# Patient Record
Sex: Male | Born: 1970 | Race: White | Hispanic: No | Marital: Married | Smoking: Former smoker
Health system: Southern US, Community
[De-identification: ages and names within clinical notes are randomized; demographics above are authoritative.]

## PROBLEM LIST (undated history)

## (undated) DIAGNOSIS — F32A Depression, unspecified: Secondary | ICD-10-CM

## (undated) DIAGNOSIS — F419 Anxiety disorder, unspecified: Secondary | ICD-10-CM

## (undated) DIAGNOSIS — F329 Major depressive disorder, single episode, unspecified: Secondary | ICD-10-CM

## (undated) HISTORY — PX: WISDOM TOOTH EXTRACTION: SHX21

## (undated) HISTORY — PX: VASECTOMY: SHX75

## (undated) HISTORY — DX: Anxiety disorder, unspecified: F41.9

## (undated) HISTORY — DX: Major depressive disorder, single episode, unspecified: F32.9

## (undated) HISTORY — DX: Depression, unspecified: F32.A

---

## 1999-03-18 ENCOUNTER — Emergency Department (HOSPITAL_COMMUNITY): Admission: EM | Admit: 1999-03-18 | Discharge: 1999-03-18 | Payer: Self-pay

## 1999-04-13 ENCOUNTER — Emergency Department (HOSPITAL_COMMUNITY): Admission: EM | Admit: 1999-04-13 | Discharge: 1999-04-13 | Payer: Self-pay | Admitting: *Deleted

## 2004-01-18 ENCOUNTER — Emergency Department (HOSPITAL_COMMUNITY): Admission: EM | Admit: 2004-01-18 | Discharge: 2004-01-18 | Payer: Self-pay | Admitting: Emergency Medicine

## 2004-01-21 ENCOUNTER — Ambulatory Visit (HOSPITAL_COMMUNITY): Admission: RE | Admit: 2004-01-21 | Discharge: 2004-01-21 | Payer: Self-pay | Admitting: Orthopaedic Surgery

## 2004-02-16 ENCOUNTER — Encounter: Admission: RE | Admit: 2004-02-16 | Discharge: 2004-02-16 | Payer: Self-pay | Admitting: Orthopaedic Surgery

## 2004-05-16 ENCOUNTER — Emergency Department (HOSPITAL_COMMUNITY): Admission: EM | Admit: 2004-05-16 | Discharge: 2004-05-16 | Payer: Self-pay | Admitting: Emergency Medicine

## 2012-07-24 ENCOUNTER — Emergency Department (HOSPITAL_COMMUNITY)
Admission: EM | Admit: 2012-07-24 | Discharge: 2012-07-24 | Disposition: A | Discharge status: Home / Self Care | Payer: No Typology Code available for payment source | Attending: Emergency Medicine | Admitting: Emergency Medicine

## 2012-07-24 ENCOUNTER — Encounter (HOSPITAL_COMMUNITY): Payer: Self-pay | Admitting: Emergency Medicine

## 2012-07-24 ENCOUNTER — Emergency Department (HOSPITAL_COMMUNITY): Payer: No Typology Code available for payment source

## 2012-07-24 DIAGNOSIS — R079 Chest pain, unspecified: Secondary | ICD-10-CM

## 2012-07-24 DIAGNOSIS — R0602 Shortness of breath: Secondary | ICD-10-CM | POA: Insufficient documentation

## 2012-07-24 DIAGNOSIS — F1510 Other stimulant abuse, uncomplicated: Secondary | ICD-10-CM

## 2012-07-24 DIAGNOSIS — Z8719 Personal history of other diseases of the digestive system: Secondary | ICD-10-CM | POA: Insufficient documentation

## 2012-07-24 DIAGNOSIS — Z79899 Other long term (current) drug therapy: Secondary | ICD-10-CM | POA: Insufficient documentation

## 2012-07-24 LAB — COMPREHENSIVE METABOLIC PANEL
Albumin: 4.1 g/dL (ref 3.5–5.2)
BUN: 12 mg/dL (ref 6–23)
CO2: 19 mEq/L (ref 19–32)
Calcium: 9.6 mg/dL (ref 8.4–10.5)
Creatinine, Ser: 0.97 mg/dL (ref 0.50–1.35)
GFR calc Af Amer: >90 mL/min (ref >90)
GFR calc non Af Amer: >90 mL/min (ref >90)
Sodium: 138 mEq/L (ref 135–145)

## 2012-07-24 LAB — POCT I-STAT TROPONIN I: Troponin i, poc: 0.01 ng/mL (ref 0.00–0.08)

## 2012-07-24 LAB — CBC
Platelets: 335 10*3/uL (ref 150–400)
RBC: 5.31 MIL/uL (ref 4.22–5.81)
RDW: 13 % (ref 11.5–15.5)

## 2012-07-24 LAB — APTT: aPTT: 33 seconds (ref 24–37)

## 2012-07-24 LAB — PROTIME-INR: INR: 1.04 (ref 0.00–1.49)

## 2012-07-24 MED ORDER — ASPIRIN 81 MG PO CHEW
324.0000 mg | CHEWABLE_TABLET | Freq: Once | ORAL | Status: AC
  Administered 2012-07-24: 324 mg via ORAL
  Filled 2012-07-24: qty 4

## 2012-07-24 MED ORDER — NITROGLYCERIN 0.4 MG SL SUBL: 0.4000 mg | SUBLINGUAL_TABLET | SUBLINGUAL | Status: DC | PRN

## 2012-07-24 MED ORDER — SODIUM CHLORIDE 0.9 % IV SOLN
1000.0000 mL | INTRAVENOUS | Status: DC
  Administered 2012-07-24: 1000 mL via INTRAVENOUS

## 2012-07-24 MED ORDER — NITROGLYCERIN 2 % TD OINT
1.0000 [in_us] | TOPICAL_OINTMENT | Freq: Once | TRANSDERMAL | Status: AC
  Administered 2012-07-24: 1 [in_us] via TOPICAL
  Filled 2012-07-24: qty 30

## 2012-07-24 MED ORDER — POTASSIUM CHLORIDE CRYS ER 20 MEQ PO TBCR
40.0000 meq | EXTENDED_RELEASE_TABLET | Freq: Once | ORAL | Status: AC
  Administered 2012-07-24: 40 meq via ORAL
  Filled 2012-07-24: qty 2

## 2012-07-24 MED ORDER — LORAZEPAM 2 MG/ML IJ SOLN
1.0000 mg | Freq: Once | INTRAMUSCULAR | Status: AC
  Administered 2012-07-24: 1 mg via INTRAVENOUS
  Filled 2012-07-24: qty 1

## 2012-07-24 MED ORDER — MORPHINE SULFATE 4 MG/ML IJ SOLN
4.0000 mg | Freq: Once | INTRAMUSCULAR | Status: AC
  Administered 2012-07-24: 4 mg via INTRAVENOUS
  Filled 2012-07-24: qty 1

## 2012-07-24 NOTE — ED Notes (Signed)
md at bedside  Pt alert and oriented x4. Respirations even and unlabored, bilateral symmetrical rise and fall of chest. Skin warm and dry. In no acute distress. Denies needs.   

## 2012-07-24 NOTE — ED Notes (Signed)
Pt states chest pain started 30 minutes ago.  Pt feels sob, shaky and weak.  Pt states he used meth in last day.

## 2012-07-24 NOTE — ED Notes (Signed)
Pt found standing beside bed trying to urinate.  HR was increased 140.  Pt placed back in bed and heart rate 75 and irregular.  MD notified.

## 2012-07-24 NOTE — ED Notes (Signed)
Per md pt allowed to have food 

## 2012-07-24 NOTE — ED Provider Notes (Signed)
History     CSN: [id number]  Arrival date & time 07/24/12  1700   First MD Initiated Contact with Patient 07/24/12 1702      Chief Complaint  Patient presents with  . Chest Pain    HPI Patient presents to the emergency room with chest pain that started about 30 minutes ago. The pain is severe in the left side of his chest. He feels short of breath shaky and weak. The patient has been using methamphetamine for the last several days. He woke up this morning stating he felt a little bit lightheaded and dizzy. He did not eat breakfast and then had several revels. Despite that, after he came home from work, he used a small amount of methamphetamine again. The chest pain started sometime after that. Patient does feel somewhat agitated. He does not feel like he can get comfortable. He denies any numbness or weakness. He denies any history of heart disease. There is no family history of heart disease but there is a family history of alcohol abuse. History reviewed. No pertinent past medical history. except a history of GERD  History reviewed. No pertinent past surgical history.  History reviewed. No pertinent family history.  History  Substance Use Topics  . Smoking status: Never Smoker   . Smokeless tobacco: Not on file  . Alcohol Use: Yes      Review of Systems  All other systems reviewed and are negative.    Allergies  Review of patient's allergies indicates no known allergies.  Home Medications   Current Outpatient Rx  Name  Route  Sig  Dispense  Refill  . ranitidine (ZANTAC) 150 MG tablet   Oral   Take 150 mg by mouth 2 (two) times daily as needed for heartburn.         . TRAZODONE HCL PO   Oral   Take 1-2 tablets by mouth at bedtime as needed (for sleep.).           BP 157/97  Pulse 98  Temp(Src) 97.9 F (36.6 C) (Oral)  Resp 17  SpO2 98%  Physical Exam  Nursing note and vitals reviewed. Constitutional: He appears well-developed and well-nourished. He  appears distressed.  Uncomfortable appearing, constantly moving in the bed  HENT:  Head: Normocephalic and atraumatic.  Right Ear: External ear normal.  Left Ear: External ear normal.  Eyes: Conjunctivae are normal. Right eye exhibits no discharge. Left eye exhibits no discharge. No scleral icterus.  Neck: Neck supple. No tracheal deviation present.  Cardiovascular: Normal rate, regular rhythm and intact distal pulses.   Pulmonary/Chest: Effort normal and breath sounds normal. No stridor. No respiratory distress. He has no wheezes. He has no rales.  Abdominal: Soft. Bowel sounds are normal. He exhibits no distension. There is no tenderness. There is no rebound and no guarding.  Musculoskeletal: He exhibits no edema and no tenderness.  Neurological: He is alert. He has normal strength. No sensory deficit. Cranial nerve deficit:  no gross defecits noted. He exhibits normal muscle tone. He displays no seizure activity. Coordination normal.  Skin: Skin is warm and dry. No rash noted.  Psychiatric: He has a normal mood and affect.    ED Course  Procedures (including critical care time) EKG Normal sinus rhythm, and rate 99 Normal axis normal intervals Normal ST-T waves No prior EKG for comparison Labs Reviewed  CBC - Abnormal; Notable for the following:    WBC 12.1 (*)    Hemoglobin 17.4 (*)  MCHC 36.6 (*)    All other components within normal limits  COMPREHENSIVE METABOLIC PANEL - Abnormal; Notable for the following:    Potassium 3.0 (*)    Glucose, Bld 102 (*)    All other components within normal limits  PROTIME-INR  APTT  POCT I-STAT TROPONIN I  POCT I-STAT TROPONIN I   Dg Chest Portable 1 View  07/24/2012  *RADIOLOGY REPORT*  Clinical Data: Chest pain and dizziness.  PORTABLE CHEST - 1 VIEW  Comparison: PA and lateral chest 05/16/2004.  Findings: Lungs clear.  Heart size normal.  No pneumothorax or pleural effusion.  IMPRESSION: No acute finding.   Original Report  Authenticated By: Holley Dexter, M.D.      1. Methamphetamine abuse   2. Chest pain       MDM  The patient has remained pain-free in the emergency department. His cardiac markers, 2 sets were normal. His EKG is unremarkable. I suspect his symptoms were related to his methamphetamine use. He does not have any evidence of cardiac ischemia at this time. Patient's heart rate the bedside is in the 70s. I instructed the patient to avoid methamphetamine use. He should decrease his consumption of revels. I recommended he eat  a good diet and exercise regularly.       Celene Kras, MD 07/24/12 2136

## 2012-10-11 ENCOUNTER — Other Ambulatory Visit (HOSPITAL_COMMUNITY): Payer: Self-pay | Admitting: Family Medicine

## 2012-10-11 DIAGNOSIS — R002 Palpitations: Secondary | ICD-10-CM

## 2012-10-11 DIAGNOSIS — G459 Transient cerebral ischemic attack, unspecified: Secondary | ICD-10-CM

## 2012-10-11 DIAGNOSIS — R55 Syncope and collapse: Secondary | ICD-10-CM

## 2012-11-12 ENCOUNTER — Ambulatory Visit (HOSPITAL_COMMUNITY)
Admission: RE | Admit: 2012-11-12 | Discharge: 2012-11-12 | Disposition: A | Discharge status: Home / Self Care | Payer: No Typology Code available for payment source | Source: Ambulatory Visit | Attending: Family Medicine | Admitting: Family Medicine

## 2012-11-12 DIAGNOSIS — R55 Syncope and collapse: Secondary | ICD-10-CM | POA: Insufficient documentation

## 2012-11-12 DIAGNOSIS — F17200 Nicotine dependence, unspecified, uncomplicated: Secondary | ICD-10-CM | POA: Insufficient documentation

## 2012-11-12 DIAGNOSIS — R29898 Other symptoms and signs involving the musculoskeletal system: Secondary | ICD-10-CM | POA: Insufficient documentation

## 2012-11-12 DIAGNOSIS — R209 Unspecified disturbances of skin sensation: Secondary | ICD-10-CM | POA: Insufficient documentation

## 2012-11-12 DIAGNOSIS — R42 Dizziness and giddiness: Secondary | ICD-10-CM

## 2012-11-12 DIAGNOSIS — G459 Transient cerebral ischemic attack, unspecified: Secondary | ICD-10-CM | POA: Insufficient documentation

## 2012-11-12 DIAGNOSIS — R002 Palpitations: Secondary | ICD-10-CM

## 2012-11-12 NOTE — Progress Notes (Signed)
*  PRELIMINARY RESULTS* Vascular Ultrasound Carotid Duplex (Doppler) has been completed.   There is no evidence of hemodynamically significant internal carotid artery stenosis bilaterally. Vertebral arteries are patent with antegrade flow.  11/12/2012 2:30 PM Gertie Fey, RVT, RDCS, RDMS

## 2012-11-12 NOTE — Progress Notes (Signed)
*  PRELIMINARY RESULTS* Echocardiogram 2D Echocardiogram has been performed.  Joe Farrell 11/12/2012, 10:36 AM

## 2013-10-13 ENCOUNTER — Ambulatory Visit (HOSPITAL_COMMUNITY)
Admission: RE | Admit: 2013-10-13 | Discharge: 2013-10-13 | Disposition: A | Discharge status: Home / Self Care | Payer: No Typology Code available for payment source | Attending: Psychiatry | Admitting: Psychiatry

## 2013-10-14 ENCOUNTER — Inpatient Hospital Stay (HOSPITAL_COMMUNITY)
Admission: EM | Admit: 2013-10-14 | Discharge: 2013-10-17 | DRG: 897 | Disposition: A | Discharge status: Rehabilitation Facility | Payer: No Typology Code available for payment source | Source: Intra-hospital | Attending: Psychiatry | Admitting: Psychiatry

## 2013-10-14 ENCOUNTER — Encounter (HOSPITAL_COMMUNITY): Payer: Self-pay | Admitting: *Deleted

## 2013-10-14 ENCOUNTER — Encounter (HOSPITAL_COMMUNITY): Payer: Self-pay | Admitting: Emergency Medicine

## 2013-10-14 ENCOUNTER — Emergency Department (HOSPITAL_COMMUNITY)
Admission: EM | Admit: 2013-10-14 | Discharge: 2013-10-14 | Disposition: A | Discharge status: Home / Self Care | Payer: No Typology Code available for payment source | Attending: Emergency Medicine | Admitting: Emergency Medicine

## 2013-10-14 DIAGNOSIS — F4310 Post-traumatic stress disorder, unspecified: Secondary | ICD-10-CM | POA: Diagnosis present

## 2013-10-14 DIAGNOSIS — F1420 Cocaine dependence, uncomplicated: Principal | ICD-10-CM | POA: Diagnosis present

## 2013-10-14 DIAGNOSIS — F17200 Nicotine dependence, unspecified, uncomplicated: Secondary | ICD-10-CM | POA: Diagnosis present

## 2013-10-14 DIAGNOSIS — R45851 Suicidal ideations: Secondary | ICD-10-CM | POA: Diagnosis not present

## 2013-10-14 DIAGNOSIS — F410 Panic disorder [episodic paroxysmal anxiety] without agoraphobia: Secondary | ICD-10-CM | POA: Diagnosis present

## 2013-10-14 DIAGNOSIS — F1994 Other psychoactive substance use, unspecified with psychoactive substance-induced mood disorder: Secondary | ICD-10-CM | POA: Diagnosis present

## 2013-10-14 DIAGNOSIS — F321 Major depressive disorder, single episode, moderate: Secondary | ICD-10-CM | POA: Diagnosis present

## 2013-10-14 DIAGNOSIS — G4700 Insomnia, unspecified: Secondary | ICD-10-CM | POA: Diagnosis present

## 2013-10-14 DIAGNOSIS — F411 Generalized anxiety disorder: Secondary | ICD-10-CM | POA: Diagnosis present

## 2013-10-14 DIAGNOSIS — R45850 Homicidal ideations: Secondary | ICD-10-CM | POA: Insufficient documentation

## 2013-10-14 DIAGNOSIS — F919 Conduct disorder, unspecified: Secondary | ICD-10-CM | POA: Insufficient documentation

## 2013-10-14 DIAGNOSIS — F1010 Alcohol abuse, uncomplicated: Secondary | ICD-10-CM | POA: Diagnosis present

## 2013-10-14 DIAGNOSIS — F1410 Cocaine abuse, uncomplicated: Secondary | ICD-10-CM | POA: Insufficient documentation

## 2013-10-14 LAB — RAPID URINE DRUG SCREEN, HOSP PERFORMED
AMPHETAMINES: NOT DETECTED
BENZODIAZEPINES: NOT DETECTED
Barbiturates: NOT DETECTED
Cocaine: POSITIVE — AB
OPIATES: NOT DETECTED
TETRAHYDROCANNABINOL: NOT DETECTED

## 2013-10-14 LAB — CBC
HCT: 46.4 % (ref 39.0–52.0)
Hemoglobin: 16.5 g/dL (ref 13.0–17.0)
MCH: 32.4 pg (ref 26.0–34.0)
MCHC: 35.6 g/dL (ref 30.0–36.0)
MCV: 91 fL (ref 78.0–100.0)
PLATELETS: 252 10*3/uL (ref 150–400)
RBC: 5.1 MIL/uL (ref 4.22–5.81)
RDW: 12.9 % (ref 11.5–15.5)
WBC: 10.9 10*3/uL — AB (ref 4.0–10.5)

## 2013-10-14 LAB — COMPREHENSIVE METABOLIC PANEL
ALBUMIN: 4 g/dL (ref 3.5–5.2)
ALT: 14 U/L (ref 0–53)
AST: 25 U/L (ref 0–37)
Alkaline Phosphatase: 99 U/L (ref 39–117)
BUN: 17 mg/dL (ref 6–23)
CALCIUM: 9.8 mg/dL (ref 8.4–10.5)
CHLORIDE: 99 meq/L (ref 96–112)
CO2: 24 meq/L (ref 19–32)
Creatinine, Ser: 1.25 mg/dL (ref 0.50–1.35)
GFR calc Af Amer: 81 mL/min — ABNORMAL LOW (ref >90)
GFR, EST NON AFRICAN AMERICAN: 70 mL/min — AB (ref >90)
Glucose, Bld: 120 mg/dL — ABNORMAL HIGH (ref 70–99)
Potassium: 4.3 mEq/L (ref 3.7–5.3)
SODIUM: 139 meq/L (ref 137–147)
Total Bilirubin: 0.4 mg/dL (ref 0.3–1.2)
Total Protein: 7.4 g/dL (ref 6.0–8.3)

## 2013-10-14 LAB — ACETAMINOPHEN LEVEL: Acetaminophen (Tylenol), Serum: <15 ug/mL (ref 10–30)

## 2013-10-14 LAB — ETHANOL: Alcohol, Ethyl (B): <11 mg/dL (ref 0–11)

## 2013-10-14 LAB — SALICYLATE LEVEL

## 2013-10-14 MED ORDER — MAGNESIUM HYDROXIDE 400 MG/5ML PO SUSP: 30.0000 mL | Freq: Every day | ORAL | Status: DC | PRN

## 2013-10-14 MED ORDER — TRAZODONE HCL 50 MG PO TABS
50.0000 mg | ORAL_TABLET | Freq: Every evening | ORAL | Status: DC | PRN
  Filled 2013-10-14 (×4): qty 1

## 2013-10-14 MED ORDER — HYDROXYZINE HCL 25 MG PO TABS
25.0000 mg | ORAL_TABLET | Freq: Four times a day (QID) | ORAL | Status: DC | PRN
  Filled 2013-10-14: qty 12

## 2013-10-14 MED ORDER — TRAZODONE HCL 100 MG PO TABS
100.0000 mg | ORAL_TABLET | Freq: Every evening | ORAL | Status: DC | PRN
  Filled 2013-10-14: qty 1
  Filled 2013-10-14: qty 8
  Filled 2013-10-14 (×5): qty 1
  Filled 2013-10-14: qty 8
  Filled 2013-10-14 (×2): qty 1

## 2013-10-14 MED ORDER — ALUM & MAG HYDROXIDE-SIMETH 200-200-20 MG/5ML PO SUSP: 30.0000 mL | ORAL | Status: DC | PRN

## 2013-10-14 MED ORDER — PANTOPRAZOLE SODIUM 20 MG PO TBEC
20.0000 mg | DELAYED_RELEASE_TABLET | Freq: Every day | ORAL | Status: DC
  Administered 2013-10-14 – 2013-10-17 (×4): 20 mg via ORAL
  Filled 2013-10-14 (×7): qty 1

## 2013-10-14 MED ORDER — LORAZEPAM 1 MG PO TABS: 1.0000 mg | ORAL_TABLET | Freq: Four times a day (QID) | ORAL | Status: DC | PRN

## 2013-10-14 MED ORDER — ACETAMINOPHEN 325 MG PO TABS: 650.0000 mg | ORAL_TABLET | Freq: Four times a day (QID) | ORAL | Status: DC | PRN
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
## 2013-10-14 NOTE — [facility] Group Notes (Signed)
[place] LCSW Group Therapy  10/14/2013 1:32 PM  Type of Therapy:  Group Therapy  Participation Level:  Active  Participation Quality:  Attentive  Affect:  Appropriate  Cognitive:  Alert and Oriented  Insight:  Improving  Engagement in Therapy:  Improving  Modes of Intervention:  Confrontation, Discussion, Education, Exploration, Problem-solving, Rapport Building, Socialization and Support  Summary of Progress/Problems: [facility] Speaker came to talk about his personal journey with substance abuse and addiction. [place] processed ways by which to relate to the speaker. [place] speaker provided handouts and educational information pertaining to groups and services offered by the [facility].   Oaklan Persons Smart LCSWA 10/14/2013, 1:32 PM
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
## 2013-10-14 NOTE — Progress Notes (Signed)
Recreation Therapy Notes  Animal-Assisted Activity/Therapy (AAA/T) Program Checklist/Progress Notes Patient Eligibility Criteria Checklist & Daily Group note for Rec Tx Intervention  Date: 05.26.2015 Time: 2:45pm Location: [address]    AAA/T Program Assumption of Risk Form signed by Patient/ or Parent Legal Guardian yes  Patient is free of allergies or sever asthma yes  Patient reports no fear of animals yes  Patient reports no history of cruelty to animals yes   Patient understands his/her participation is voluntary yes  Behavioral Response: Did not attend.    Laureen Ochs Eilee Schader, LRT/CTRS  Lane Hacker 10/14/2013 4:57 PM

## 2013-10-14 NOTE — Discharge Instructions (Signed)
Polysubstance Abuse When people abuse more than one drug or type of drug it is called polysubstance or polydrug abuse. For example, many smokers also drink alcohol. This is one form of polydrug abuse. Polydrug abuse also refers to the use of a drug to counteract an unpleasant effect produced by another drug. It may also be used to help with withdrawal from another drug. People who take stimulants may become agitated. Sometimes this agitation is countered with a tranquilizer. This helps protect against the unpleasant side effects. Polydrug abuse also refers to the use of different drugs at the same time.  Anytime drug use is interfering with normal living activities, it has become abuse. This includes problems with family and friends. Psychological dependence has developed when your mind tells you that the drug is needed. This is usually followed by physical dependence which has developed when continuing increases of drug are required to get the same feeling or "high". This is known as addiction or chemical dependency. A person's risk is much higher if there is a history of chemical dependency in the family. SIGNS OF CHEMICAL DEPENDENCY  You have been told by friends or family that drugs have become a problem.  You fight when using drugs.  You are having blackouts (not remembering what you do while using).  You feel sick from using drugs but continue using.  You lie about use or amounts of drugs (chemicals) used.  You need chemicals to get you going.  You are suffering in work performance or in school because of drug use.  You get sick from use of drugs but continue to use anyway.  You need drugs to relate to people or feel comfortable in social situations.  You use drugs to forget problems. "Yes" answered to any of the above signs of chemical dependency indicates there are problems. The longer the use of drugs continues, the greater the problems will become. If there is a family history of  drug or alcohol use, it is best not to experiment with these drugs. Continual use leads to tolerance. After tolerance develops more of the drug is needed to get the same feeling. This is followed by addiction. With addiction, drugs become the most important part of life. It becomes more important to take drugs than participate in the other usual activities of life. This includes relating to friends and family. Addiction is followed by dependency. Dependency is a condition where drugs are now needed not just to get high, but to feel normal. Addiction cannot be cured but it can be stopped. This often requires outside help and the care of professionals. Treatment centers are listed in the yellow pages under: Cocaine, Narcotics, and Alcoholics Anonymous. Most hospitals and clinics can refer you to a specialized care center. Talk to your caregiver if you need help. Document Released: 12/28/2004 Document Revised: 07/31/2011 Document Reviewed: 05/08/2005 [facility] Patient Information 2014 [place].
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
## 2013-10-14 NOTE — Progress Notes (Signed)
D: Patient denies SI/HI and auditory and visual hallucinations. The patient has an irritable mood and affect. The patient has been resting frequently throughout the shift due to detox. The patient is having some tremors, anxiety, and headache.  A: Patient given emotional support from RN. Patient encouraged to come to staff with concerns and/or questions. Patient's medication routine continued. Patient's orders and plan of care reviewed. Patient was offered medication for symptoms but patient denies any at this time.  R: Patient remains cooperative. Will continue to monitor patient q15 minutes for safety.
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
## 2013-10-14 NOTE — ED Provider Notes (Signed)
CSN: [id number]     Arrival date & time 10/14/13  0037 History   First MD Initiated Contact with Patient 10/14/13 0103     Chief Complaint  Patient presents with  . Detox     (Consider location/radiation/quality/duration/timing/severity/associated sxs/prior Treatment) HPI Comments: 43 year old male presents to the emergency department and behavioral health for medical clearance. Patient with bad at behavioral health for further evaluation and management of his suicidal ideations. Patient denies any suicidal plan. Patient also reports homicidal ideations to triage nurse, but would not state who he wants to harm. Patient also a history of cocaine abuse. Last cocaine use was 10 PM yesterday. Patient denies alcohol use or any other abuse drugs. He has no other pain complaints at this time.  The history is provided by the patient. No language interpreter was used.    History reviewed. No pertinent past medical history. Past Surgical History  Procedure Laterality Date  . Wisdom tooth extraction     History reviewed. No pertinent family history. History  Substance Use Topics  . Smoking status: Current Every Day Smoker -- 1.00 packs/day    Types: Cigarettes  . Smokeless tobacco: Never Used  . Alcohol Use: Yes    Review of Systems  Psychiatric/Behavioral: Positive for suicidal ideas and behavioral problems (substance abuse).  All other systems reviewed and are negative.    Allergies  Review of patient's allergies indicates no known allergies.  Home Medications   Prior to Admission medications   Medication Sig Start Date End Date Taking? Authorizing Provider  ranitidine (ZANTAC) 150 MG tablet Take 150 mg by mouth 2 (two) times daily as needed for heartburn.    Historical Provider, MD  TRAZODONE HCL PO Take 1-2 tablets by mouth at bedtime as needed (for sleep.).    Historical Provider, MD   BP 109/86  Pulse 73  Temp(Src) 98.1 F (36.7 C) (Oral)  Resp 18  Ht 6' 1.5" (1.867 m)   Wt 197 lb (89.359 kg)  BMI 25.64 kg/m2  SpO2 97%  Physical Exam  Nursing note and vitals reviewed. Constitutional: He is oriented to person, place, and time. He appears well-developed and well-nourished. No distress.  HENT:  Head: Normocephalic and atraumatic.  Eyes: Conjunctivae and EOM are normal. No scleral icterus.  Neck: Normal range of motion.  Cardiovascular: Normal rate, regular rhythm and normal heart sounds.   Pulmonary/Chest: Effort normal and breath sounds normal. No respiratory distress. He has no wheezes. He has no rales.  Abdominal: Soft. He exhibits no distension. There is no tenderness.  Musculoskeletal: Normal range of motion.  Neurological: He is alert and oriented to person, place, and time. GCS eye subscore is 4. GCS verbal subscore is 5. GCS motor subscore is 6.  Patient answers questions appropriately and follows commands. He moves his extremities without ataxia.  Skin: Skin is warm and dry. No rash noted. He is not diaphoretic. No erythema. No pallor.  Psychiatric: He has a normal mood and affect. His speech is normal. He is slowed. He is not actively hallucinating. He expresses suicidal ideation. He expresses no homicidal ideation. He expresses no suicidal plans and no homicidal plans.  Patient appears sleepy and slowed.    ED Course  Procedures (including critical care time) Labs Review Labs Reviewed  CBC - Abnormal; Notable for the following:    WBC 10.9 (*)    All other components within normal limits  COMPREHENSIVE METABOLIC PANEL - Abnormal; Notable for the following:    Glucose, Bld 120 (*)  GFR calc non Af Amer 70 (*)    GFR calc Af Amer 81 (*)    All other components within normal limits  SALICYLATE LEVEL - Abnormal; Notable for the following:    Salicylate Lvl <0.3 (*)    All other components within normal limits  URINE RAPID DRUG SCREEN (HOSP PERFORMED) - Abnormal; Notable for the following:    Cocaine POSITIVE (*)    All other components  within normal limits  ACETAMINOPHEN LEVEL  ETHANOL    Imaging Review No results found.   EKG Interpretation None      MDM   Final diagnoses:  Suicidal ideations  Cocaine abuse    Patient presents for medical clearance for suicidal ideations and cocaine abuse. Patient accepted at behavioral health for further management of psychiatric problems. Labs reviewed; positive for cocaine on UDS. Patient medically cleared and stable for transfer back to [facility]   Filed Vitals:   10/14/13 0040 10/14/13 0306  BP: 117/75 109/86  Pulse: 90 73  Temp: 98.1 F (36.7 C) 98.1 F (36.7 C)  TempSrc: Oral Oral  Resp: 16 18  Height: 6' 1.5" (1.867 m)   Weight: 197 lb (89.359 kg)   SpO2: 98% 97%       Antonietta Breach, PA-C 10/14/13 [phone]
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
## 2013-10-14 NOTE — ED Notes (Signed)
Report given to RN at [facility], pt ready for transportation to [facility], [place] called

## 2013-10-14 NOTE — Progress Notes (Signed)
[facility] - 0900  The focus of this group is to educate the patient on the purpose and policies of crisis stabilization and provide a format to answer questions about their admission.  The group details unit policies and expectations of patients while admitted.  Patient did not attend.

## 2013-10-15 NOTE — [facility] Group Notes (Signed)
[place] LCSW Group Therapy  10/15/2013 2:59 PM  Type of Therapy:  Group Therapy  Participation Level:  Did Not Attend-pt in room sleeping.   Meloney Feld Smart LCSWA  10/15/2013, 2:59 PM

## 2013-10-15 NOTE — ED Provider Notes (Signed)
Medical screening examination/treatment/procedure(s) were performed by non-physician practitioner and as supervising physician I was immediately available for consultation/collaboration.   EKG Interpretation None       Varney Biles, MD 10/15/13 [phone]

## 2013-10-15 NOTE — Clinical Social Work Note (Signed)
CSW submitted referral to [facility] [place] per pt request.  Maxie Better, LCSWA 10/15/2013 1:05 PM
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
## 2013-10-15 NOTE — Progress Notes (Signed)
Patient ID: Joe Farrell, male   DOB: 1970/08/04, 43 y.o.   MRN: [id number] He has been in bed most of day and up for part of a group.  Has had poor interaction with peers and staff, Self inventory: depreswsion 8, Hopelessness 8, withdrawal of agitation and he denies SI thoughts.

## 2013-10-15 NOTE — Progress Notes (Signed)
D Pt. Denies SI and HI, no complaints of pain or discomfort noted. A Writer offered support and encouragement, discussed needs with pt.  R Pt. Remains safe on the unit,  Pt. Reports depression has lessened, but appeared very anxious.  Pt. States his biggest issue is nicotine withdrawal but refused a patch at this time.

## 2013-10-15 NOTE — Tx Team (Signed)
Interdisciplinary Treatment Plan Update (Adult)  Date: 10/15/2013   Time Reviewed: 11:30 AM  Progress in Treatment:  Attending groups: intermittently  Participating in groups: When he attends  Taking medication as prescribed: Yes  Tolerating medication: Yes  Family/Significant othe contact made: Not yet. SPE required for this pt.  Patient understands diagnosis: Yes, AEB seeking treatment for crack cocaine abuse, medication management, and mood stabilization.  Discussing patient identified problems/goals with staff: Yes  Medical problems stabilized or resolved: Yes  Denies suicidal/homicidal ideation: Yes during group/self report.  Patient has not harmed self or Others: Yes  New problem(s) identified:  Discharge Plan or Barriers: Pt hoping for [facility] and [facility] referral. CSW assessing. Pt unsure if he can miss any more work.  Additional comments: [name] who stated he had a period of sobriety, but in March the use of crack has gotten out of control. States he just "got addicted." Used to drink until he started smoking crack this way. [name] He is drinking less now. He came encouraged by his GF requesting help. He was endorsing suicidal ideas, with multiple ways of doing it. He has had two previous suicidal attempts. ( stated he had been up 3 days using crack and that he just wants to be left alone and sleep)  Reason for Continuation of Hospitalization: Medication management Mood stabilization Ativan taper-withdrawals  Estimated length of stay: 3-5 days  For review of initial/current patient goals, please see plan of care.  Attendees:  Patient:    Family:    Physician: Carlton Adam MD 10/15/2013 11:30 AM   Nursing: Arminda Resides RN 10/15/2013 11:30 AM   Clinical Social Worker [place], [place]  10/15/2013 11:30 AM   Other: Gerline Legacy Nurse CM 10/15/2013 11:30 AM   Other:    Other: Aggie N. PA  10/15/2013. 11:30 AM   Other:    Scribe for Treatment Team:  [place] LCSWA 10/15/2013  11:30 AM
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
## 2013-10-15 NOTE — BHH Group Notes (Signed)
[facility] LCSW Aftercare Discharge Planning Group Note   10/15/2013 10:14 AM  Participation Quality:  DID NOT ATTEND-pt in room resting  [facility]

## 2013-10-16 NOTE — Progress Notes (Signed)
D.  Pt. Denies SI/HI and denies A/V hallucinations.  Attended Starr.  Denies withdrawal symptoms.  No concerns or issues voiced. A.  Encouragement and support given.  R.  Pt. Receptive and remains safe.

## 2013-10-16 NOTE — Progress Notes (Signed)
[facility] MD Progress Note  10/16/2013 5:39 PM JAYLYNN SIEFERT  MRN:  [id number] Subjective:  Jeromey is trying to get his life back together. States that he knows he needs to do something or he is going to lose everything he has got. He has work that he has to do by a certain date otherwise he is going to be penalized financially. He also has his son's graduation and going into the [place] [employer] [place] in the next few weeks. That is waiting heavily on his mind but also understands that the consequences of not taking care of this problem now Diagnosis:   DSM5: Schizophrenia Disorders:  none Obsessive-Compulsive Disorders:  none Trauma-Stressor Disorders:  none Substance/Addictive Disorders:  Alcohol Related Disorder - Moderate (303.90), Cocaine use disorder severe Depressive Disorders:  Major Depressive Disorder - Moderate (296.22) Total Time spent with patient: 30 minutes  Axis I: Substance Induced Mood Disorder  ADL's:  Intact  Sleep: Fair  Appetite:  Fair  Suicidal Ideation:  Plan:  denies Intent:  denies Means:  denies Homicidal Ideation:  Plan:  denies Intent:  denies Means:  denies AEB (as evidenced by):  Psychiatric Specialty Exam: Physical Exam  Review of Systems  Constitutional: Positive for malaise/fatigue.  HENT: Negative.   Eyes: Negative.   Respiratory: Negative.   Cardiovascular: Negative.   Gastrointestinal: Negative.   Genitourinary: Negative.   Musculoskeletal: Negative.   Skin: Negative.   Neurological: Negative.   Endo/Heme/Allergies: Negative.   Psychiatric/Behavioral: Positive for substance abuse. The patient is nervous/anxious.     Blood pressure 123/79, pulse 74, temperature 97.9 F (36.6 C), temperature source Oral, resp. rate 17, height 6' 1"  (1.854 m), weight 83.462 kg (184 lb), SpO2 98.00%.Body mass index is 24.28 kg/(m^2).  General Appearance: Fairly Groomed  Engineer, water::  Fair  Speech:  Clear and Coherent  Volume:  fluctuates  Mood:  Anxious, Irritable and  worried, frustrated  Affect:  anxious, worried  Thought Process:  Coherent and Goal Directed  Orientation:  Full (Time, Place, and Person)  Thought Content:  symtpoms, worries, concerns  Suicidal Thoughts:  No  Homicidal Thoughts:  No  Memory:  Immediate;   Fair Recent;   Fair Remote;   Fair  Judgement:  Fair  Insight:  Present and Shallow  Psychomotor Activity:  Restlessness  Concentration:  Fair  Recall:  [employer] of Knowledge:NA  Language: Fair  Akathisia:  No  Handed:    AIMS (if indicated):     Assets:  Desire for Improvement  Sleep:  Number of Hours: 6.75   Musculoskeletal: Strength & Muscle Tone: within normal limits Gait & Station: normal Patient leans: N/A  Current Medications: Current Facility-Administered Medications  Medication Dose Route Frequency Provider Last Rate Last Dose  . acetaminophen (TYLENOL) tablet 650 mg  650 mg Oral Q6H PRN Laverle Hobby, PA-C      . alum & mag hydroxide-simeth (MAALOX/MYLANTA) 200-200-20 MG/5ML suspension 30 mL  30 mL Oral Q4H PRN Laverle Hobby, PA-C      . hydrOXYzine (ATARAX/VISTARIL) tablet 25 mg  25 mg Oral Q6H PRN Laverle Hobby, PA-C      . LORazepam (ATIVAN) tablet 1 mg  1 mg Oral Q6H PRN Nicholaus Bloom, MD      . magnesium hydroxide (MILK OF MAGNESIA) suspension 30 mL  30 mL Oral Daily PRN Laverle Hobby, PA-C      . pantoprazole (PROTONIX) EC tablet 20 mg  20 mg Oral Daily Laverle Hobby, PA-C  20 mg at 10/16/13 0801  . traZODone (DESYREL) tablet 100 mg  100 mg Oral QHS,MR X 1 Nicholaus Bloom, MD        Lab Results: No results found for this or any previous visit (from the past 48 hour(s)).  Physical Findings: AIMS: Facial and Oral Movements Muscles of Facial Expression: None, normal Lips and Perioral Area: None, normal Jaw: None, normal Tongue: None, normal,Extremity Movements Upper (arms, wrists, hands, fingers): None, normal Lower (legs, knees, ankles, toes): None, normal, Trunk Movements Neck, shoulders,  hips: None, normal, Overall Severity Severity of abnormal movements (highest score from questions above): None, normal Incapacitation due to abnormal movements: None, normal Patient's awareness of abnormal movements (rate only patient's report): No Awareness, Dental Status Current problems with teeth and/or dentures?: No Does patient usually wear dentures?: No  CIWA:  CIWA-Ar Total: 5 COWS:     Treatment Plan Summary: Daily contact with patient to assess and evaluate symptoms and progress in treatment Medication management  Plan: Supportive approach/coping skills/relapse prevention            Reassess and address the co morbidities  Medical Decision Making Problem Points:  Review of psycho-social stressors (1) Data Points:  Review of medication regiment & side effects (2)  I certify that inpatient services furnished can reasonably be expected to improve the patient's condition.   Nicholaus Bloom 10/16/2013, 5:39 PM

## 2013-10-16 NOTE — Clinical Social Work Note (Signed)
CSW referred pt to [facility].  [facility] is reviewing pt's information today and pt was to call to interview with them today.  [facility] ran [employer] and it is out of network with a $4,000 deductible and they would require a $5,000 deposit on admission.  CSW relayed this information to pt.  CSW continuing to assess for appropriate referrals.    Regan Lemming, LCSW 10/16/2013  12:06 PM

## 2013-10-16 NOTE — BHH Group Notes (Signed)
[facility] LCSW Group Therapy  10/16/2013   1:15 PM   Type of Therapy:  Group Therapy  Participation Level:  Active  Participation Quality:  Attentive, Sharing and Supportive  Affect:  Depressed and Flat  Cognitive:  Alert and Oriented  Insight:  Developing/Improving and Engaged  Engagement in Therapy:  Developing/Improving and Engaged  Modes of Intervention:  Activity, Clarification, Confrontation, Discussion, Education, Exploration, Limit-setting, Orientation, Problem-solving, Rapport Building, Art therapist, Socialization and Support  Summary of Progress/Problems: Patient was attentive and engaged with speaker from [place].  Patient was attentive to speaker while they shared their story of dealing with mental health and overcoming it.  Patient expressed interest in their programs and services and received information on their agency.  Patient processed ways they can relate to the speaker.     Regan Lemming, LCSW 10/16/2013  1:33 PM

## 2013-10-16 NOTE — Progress Notes (Signed)
Adult Psychoeducational Group Note  Date:  10/16/2013 Time:  12:09 AM  Group Topic/Focus:  NA group  Participation Level:  Active  Participation Quality:  Appropriate  Affect:  Appropriate  Cognitive:  Alert  Insight: Appropriate  Engagement in Group:  Engaged  Modes of Intervention:  Discussion  Additional Comments:  Pt attended NA group this evening.  Sharmon Revere 10/16/2013, 12:09 AM

## 2013-10-16 NOTE — BHH Suicide Risk Assessment (Signed)
[place] INPATIENT:  Family/Significant Other Suicide Prevention Education  Suicide Prevention Education:  Education Completed; Joe Farrell - brother [phone]),  (name of family member/significant other) has been identified by the patient as the family member/significant other with whom the patient will be residing, and identified as the person(s) who will aid the patient in the event of a mental health crisis (suicidal ideations/suicide attempt).  With written consent from the patient, the family member/significant other has been provided the following suicide prevention education, prior to the and/or following the discharge of the patient.  The suicide prevention education provided includes the following:  Suicide risk factors  Suicide prevention and interventions  National Suicide Hotline telephone number  [facility] assessment telephone number  [facility] Emergency Assistance [place] and/or Residential Mobile Crisis Unit telephone number  Request made of family/significant other to:  Remove weapons (e.g., guns, rifles, knives), all items previously/currently identified as safety concern.    Remove drugs/medications (over-the-counter, prescriptions, illicit drugs), all items previously/currently identified as a safety concern.  The family member/significant other verbalizes understanding of the suicide prevention education information provided.  The family member/significant other agrees to remove the items of safety concern listed above.  Joe Farrell 10/16/2013, 12:50 PM

## 2013-10-16 NOTE — Progress Notes (Signed)
Pt attended karaoke group this evening. Pt was appropriate and participate in group.

## 2013-10-16 NOTE — Progress Notes (Signed)
Patient ID: Joe Farrell, male   DOB: 1970-12-30, 43 y.o.   MRN: [id number] He has been in bed most of AM and has been  up for meals and medications. Self inventory: Depression and hopelessness 8, withdrawal of agitation, denies SI thoughts. Has been up this afternoon and has gone to group interacting with peers and staff.

## 2013-10-17 DIAGNOSIS — F1020 Alcohol dependence, uncomplicated: Secondary | ICD-10-CM

## 2013-10-17 DIAGNOSIS — F1420 Cocaine dependence, uncomplicated: Principal | ICD-10-CM

## 2013-10-17 MED ORDER — HYDROXYZINE HCL 25 MG PO TABS
25.0000 mg | ORAL_TABLET | Freq: Three times a day (TID) | ORAL | Status: DC | PRN
  Filled 2013-10-17: qty 12

## 2013-10-17 MED ORDER — ACETAMINOPHEN 325 MG PO TABS: 650.0000 mg | ORAL_TABLET | Freq: Four times a day (QID) | ORAL | Status: DC | PRN

## 2013-10-17 MED ORDER — TRAZODONE HCL 100 MG PO TABS: 100.0000 mg | ORAL_TABLET | Freq: Every evening | ORAL | Status: DC | PRN

## 2013-10-17 MED ORDER — HYDROXYZINE HCL 25 MG PO TABS: ORAL_TABLET | ORAL | Status: DC

## 2013-10-17 NOTE — Discharge Summary (Signed)
Physician Discharge Summary Note  Patient:  Joe Farrell is an 43 y.o., male MRN:  [id number] DOB:  May 05, 1971 Patient phone:  [phone] (home)  Patient address:   [place] [address],  Total Time spent with patient: Greater than 30 minutes  Date of Admission:  10/14/2013 Date of Discharge: 10/17/13  Reason for Admission:  Alcohol and drug detox  Discharge Diagnoses: Active Problems:   Substance induced mood disorder   Cocaine dependence   Alcohol abuse  Psychiatric Specialty Exam: Physical Exam  Psychiatric: His speech is normal and behavior is normal. Judgment and thought content normal. His mood appears not anxious. His affect is not angry, not blunt, not labile and not inappropriate. Cognition and memory are normal. He does not exhibit a depressed mood.    Review of Systems  Constitutional: Negative.   HENT: Negative.   Eyes: Negative.   Respiratory: Negative.   Cardiovascular: Negative.   Gastrointestinal: Negative.   Genitourinary: Negative.   Musculoskeletal: Negative.   Skin: Negative.   Neurological: Negative.   Endo/Heme/Allergies: Negative.   Psychiatric/Behavioral: Positive for substance abuse (Alcoholism, cocaine dependence). Negative for depression, suicidal ideas, hallucinations and memory loss. The patient has insomnia (Stabilized with medication prior to discharge). The patient is not nervous/anxious.     Blood pressure 147/91, pulse 75, temperature 97.8 F (36.6 C), temperature source Oral, resp. rate 16, height 6' 1"  (1.854 m), weight 83.462 kg (184 lb), SpO2 98.00%.Body mass index is 24.28 kg/(m^2).   General Appearance: Fairly Groomed   Engineer, water:: Fair   Speech: Clear and Coherent   Volume: Decreased   Mood: worried   Affect: Appropriate   Thought Process: Coherent and Goal Directed   Orientation: Full (Time, Place, and Person)   Thought Content: plans as he moves on, worries, concerns   Suicidal Thoughts: No    Homicidal Thoughts: No   Memory: Immediate; Fair  Recent; Fair  Remote; Fair   Judgement: Fair   Insight: Present   Psychomotor Activity: Normal   Concentration: Fair   Recall: [employer] of Knowledge:NA   Language: Fair   Akathisia: No   Handed:   AIMS (if indicated):   Assets: Desire for Joe Farrell   Sleep: Number of Hours: 6    Past Psychiatric History: Diagnosis: Alcohol dependence, Cocaine dependence, Substance induced mood disorder  Hospitalizations: [facility] adult unit  Outpatient Care: [facility]  Substance Abuse Care: [facility]  Self-Mutilation: NA  Suicidal Attempts: NA  Violent Behaviors: NA   Musculoskeletal: Strength & Muscle Tone: within normal limits Gait & Station: normal Patient leans: N/A  DSM5: Schizophrenia Disorders:  NA Obsessive-Compulsive Disorders:  NA Trauma-Stressor Disorders:  NA Substance/Addictive Disorders:  Alcohol Related Disorder - Moderate (303.90) Depressive Disorders:  Substance induced mood disorder  Axis Diagnosis:   AXIS I:  Alcohol dependence, Cocaine dependence, Substance induced mood disorder  AXIS II:  Deferred  AXIS III:  History reviewed. No pertinent past medical history.  AXIS IV:  other psychosocial or environmental problems and Substance dependence  AXIS V:  62  Level of Care:  OP  Hospital Course: [name] who stated he had a period of sobriety, but in March the use of crack has gotten out of control. States he just "got addicted." Used to drink until he started smoking crack this way. [name] He is drinking less now. He came encouraged by his GF requesting help. He was endorsing suicidal ideas, with multiple  ways of doing it. He has had two previous suicidal attempts. ( stated he had been up 3 days using crack and that he just wants to be left alone and sleep).  Joe Farrell was admitted to the hospital with his UDS reports positive for Cocaine. He was asking for help with  his substance abuse issues which is now chronic. He received no detoxification treatment protocols because cocaine intoxication/withdrawal symptoms has no established detoxification treatment protocols. However, he received medication management on a prn basis to combat any associated anxiety symptoms. He also was ordered, received and discharged on Hydroxyzine 25 mg three times daily as needed for anxiety and Trazodone 100 mg Q bedtime daily for sleep. He was enrolled and participated in the group counseling sessions and AA/NA meetings being offered on this unit. He learned coping skills. Joe Farrell's discharge plans included a referral and admission to the [facility] treatment center in [place], [place] to continue treatment after discharge.  Joe Farrell is stable to be discharged to continue treatment at the [facility] treatment center. He will [facility] unit to [facility] after discharge. His mood is stable. He has no withdrawal symptoms and no SIHI, AVH, paranoia and or delusional thoughts. He received from [facility] a 4 days worth supply samples of his [facility] discharge medications. He left [facility] with all belongings in no distress. Transportation per [employer].  Consults:  psychiatry  Significant Diagnostic Studies:  labs: CBC with diff, CMP, UDS, toxicology tests, U/A  Discharge Vitals:   Blood pressure 147/91, pulse 75, temperature 97.8 F (36.6 C), temperature source Oral, resp. rate 16, height 6' 1"  (1.854 m), weight 83.462 kg (184 lb), SpO2 98.00%. Body mass index is 24.28 kg/(m^2). Lab Results:   No results found for this or any previous visit (from the past 72 hour(s)).  Physical Findings: AIMS: Facial and Oral Movements Muscles of Facial Expression: None, normal Lips and Perioral Area: None, normal Jaw: None, normal Tongue: None, normal,Extremity Movements Upper (arms, wrists, hands, fingers): None, normal Lower (legs, knees, ankles, toes): None, normal, Trunk Movements Neck, shoulders, hips: None, normal,  Overall Severity Severity of abnormal movements (highest score from questions above): None, normal Incapacitation due to abnormal movements: None, normal Patient's awareness of abnormal movements (rate only patient's report): No Awareness, Dental Status Current problems with teeth and/or dentures?: No Does patient usually wear dentures?: No  CIWA:  CIWA-Ar Total: 5 COWS:     Psychiatric Specialty Exam: See Psychiatric Specialty Exam and Suicide Risk Assessment completed by Attending Physician prior to discharge.  Discharge destination:  Home  Is patient on multiple antipsychotic therapies at discharge:  No   Has Patient had three or more failed trials of antipsychotic monotherapy by history:  No  Recommended Plan for Multiple Antipsychotic Therapies: NA    Medication List    STOP taking these medications       ibuprofen 200 MG tablet  Commonly known as:  ADVIL,MOTRIN      TAKE these medications     Indication   acetaminophen 325 MG tablet  Commonly known as:  TYLENOL  Take 2 tablets (650 mg total) by mouth every 6 (six) hours as needed for headache.   Indication:  Pain     hydrOXYzine 25 MG tablet  Commonly known as:  ATARAX/VISTARIL  Take 1 tablet (25 mg) three time daily as needed for anxiety   Indication:  Anxiety associated with Organic Disease, Tension     traZODone 100 MG tablet  Commonly known as:  DESYREL  Take 1 tablet (100 mg total) by mouth at bedtime and may repeat dose one time if needed. For sleep   Indication:  Trouble Sleeping       Follow-up Information   Follow up with [facility] On 10/17/2013. ([facility] will pick you up at 1:45 pm today, for further inpatient treatment)    Contact information:   [address] [place], [place] [address] Phone: [phone] Fax: [phone]       Follow up with [facility]. (Schedule upon discharge from [facility], for outpatient therapy and/or intensive outpatient program)    Contact information:   [address]  [place], [place] [address] Phone: [phone] Fax: [phone]     Follow-up recommendations:  Activity:  As tolerated Diet: As recommended by your primary care doctor. Keep all scheduled follow-up appointments as recommended.  Comments: Take all your medications as prescribed by your mental healthcare provider. Report any adverse effects and or reactions from your medicines to your outpatient provider promptly. Patient is instructed and cautioned to not engage in alcohol and or illegal drug use while on prescription medicines. In the event of worsening symptoms, patient is instructed to call the crisis hotline, 911 and or go to the nearest ED for appropriate evaluation and treatment of symptoms. Follow-up with your primary care provider for your other medical issues, concerns and or health care needs.     Total Discharge Time:  Greater than 30 minutes.  Signed: Encarnacion Slates, PMHNP, FNP-BC 10/17/2013, 11:00 AM I personally assessed the patient and formulated the plan Geralyn Flash A. Sabra Heck, M.D.

## 2013-10-17 NOTE — Progress Notes (Signed)
[facility] Adult Case Management Discharge Plan :  Will you be returning to the same living situation after discharge: Yes,  pt can return home after completing treatment at [facility].  Pt's family and work are supportive.  At discharge, do you have transportation home?:Yes,  [facility] will pick pt up today at 1:45 pm to transport him to [facility] Do you have the ability to pay for your medications:Yes,  provided pt with prescriptions and pt verbalizes ability to afford meds  Release of information consent forms completed and in the chart;  Patient's signature needed at discharge.  Patient to Follow up at: Follow-up Information   Follow up with [facility] On 10/17/2013. ([facility] will pick you up at 1:45 pm today, for further inpatient treatment)    Contact information:   [address] [place], [place] [address] Phone: [phone] Fax: [phone]       Follow up with [facility]. (Schedule upon discharge from [facility], for outpatient therapy and/or intensive outpatient program)    Contact information:   [address]  [place], [place] [address] Phone: [phone] Fax: [phone]      Patient denies SI/HI:   Yes,  denies SI/HI    Safety Planning and Suicide Prevention discussed:  Yes,  discussed with pt and pt's brother.  See suicide prevention education note.   Joe Farrell 10/17/2013, 10:35 AM
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
## 2013-10-17 NOTE — Progress Notes (Signed)
Adult Psychoeducational Group Note  Date:  10/17/2013 Time:  5:05 PM  Group Topic/Focus:  Relapse Prevention Planning:   The focus of this group is to define relapse and discuss the need for planning to combat relapse.  Participation Level:  Active  Participation Quality:  Appropriate, Attentive, Sharing and Supportive  Affect:  Appropriate  Cognitive:  Appropriate  Insight: Appropriate  Engagement in Group:  Engaged and Supportive  Modes of Intervention:  Discussion, Socialization and Support  Additional Comments:   Clint Bolder 10/17/2013, 5:05 PM

## 2013-10-17 NOTE — Progress Notes (Signed)
Patient ID: Joe Farrell, male   DOB: 09/28/1970, 43 y.o.   MRN: [id number]  D: Patient at baseline of functioning. Met with physician and treatment team felt patient ready for discharge.  A: Obtained all belongings, prescriptions, medication samples, and follow up appointment. R: Taken to lobby to wait for [facility] to pick up at 1:45.

## 2013-10-17 NOTE — BHH Group Notes (Signed)
[facility] LCSW Aftercare Discharge Planning Group Note   10/17/2013 8:45 AM  Participation Quality:  Alert, Appropriate and Oriented  Mood/Affect:  Calm  Depression Rating:  2  Anxiety Rating:  2  Thoughts of Suicide:  Pt denies SI/HI  Will you contract for safety?   Yes  Current AVH:  Pt denies  Plan for Discharge/Comments:  Pt attended discharge planning group and actively participated in group.  CSW provided pt with today's workbook.  Pt reports feeling ready to d/c today.  Pt has follow up scheduled at [facility] today for further inpatient treatment.  CSW will also provide pt with outpatient referrals for when he completes with [facility].  Pt can return home upon d/c from treatment.  No further needs voiced by pt at this time.    Transportation Means: Pt reports access to transportation - [facility] will pick pt up today  Supports: No supports mentioned at this time  Regan Lemming, LCSW 10/17/2013 9:43 AM

## 2013-10-17 NOTE — Tx Team (Signed)
Interdisciplinary Treatment Plan Update (Adult)  Date: 10/17/2013  Time Reviewed:  9:45 AM  Progress in Treatment: Attending groups: Yes Participating in groups:  Yes Taking medication as prescribed:  Yes Tolerating medication:  Yes Family/Significant othe contact made: Yes, with pt's brother Patient understands diagnosis:  Yes Discussing patient identified problems/goals with staff:  Yes Medical problems stabilized or resolved:  Yes Denies suicidal/homicidal ideation: Yes Issues/concerns per patient self-inventory:  Yes Other:  New problem(s) identified: N/A  Discharge Plan or Barriers: Pt will follow up at [facility] for further inpatient treatment today.  Pt also has info on [facility] for outpatient treatment after completing [facility].    Reason for Continuation of Hospitalization: Stable to d/c today  Comments: N/A  Estimated length of stay: D/C today  For review of initial/current patient goals, please see plan of care.  Attendees: Patient:     Family:     Physician:  Dr. Sabra Heck 10/17/2013 10:34 AM   Nursing:   Luz Brazen, RN 10/17/2013 10:34 AM   Clinical Social Worker:  Regan Lemming, LCSW 10/17/2013 10:34 AM   Other: Lars Pinks, RN case manager 10/17/2013 10:34 AM   Other:  Maxie Better, [place] 10/17/2013 10:34 AM   Other:  Agustina Caroli, NP 10/17/2013 10:34 AM   Other:  Maureen Chatters, RN 10/17/2013 10:34 AM   Other: Norberto Sorenson, care coordination 10/17/2013 10:34 AM   Other:    Other:    Other:    Other:    Other:     Scribe for Treatment Team:   Ane Payment, 10/17/2013 , 10:34 AM

## 2013-10-21 NOTE — Progress Notes (Addendum)
Patient Discharge Instructions:  After Visit Summary (AVS):   Faxed to:  10/21/13 Discharge Summary Note:   Faxed to:  10/21/13 Psychiatric Admission Assessment Note:   Faxed to:  10/21/13 Suicide Risk Assessment - Discharge Assessment:   Faxed to:  10/21/13 Faxed/Sent to the Next Level Care provider:  10/21/13 Faxed to [facility] @ [phone] Per the sw the records have already been sent to [facility]. Patsey Berthold, 10/21/2013, 3:46 PM

## 2013-12-11 ENCOUNTER — Ambulatory Visit (INDEPENDENT_AMBULATORY_CARE_PROVIDER_SITE_OTHER): Payer: No Typology Code available for payment source | Admitting: Emergency Medicine

## 2013-12-11 VITALS — BP 124/80 | HR 78 | Temp 98.1°F | Resp 16 | Ht 74.75 in | Wt 198.8 lb

## 2013-12-11 DIAGNOSIS — F1994 Other psychoactive substance use, unspecified with psychoactive substance-induced mood disorder: Secondary | ICD-10-CM

## 2013-12-11 DIAGNOSIS — F1911 Other psychoactive substance abuse, in remission: Secondary | ICD-10-CM

## 2013-12-11 MED ORDER — FLUOXETINE HCL 20 MG PO CAPS: 20.0000 mg | ORAL_CAPSULE | Freq: Every day | ORAL | Status: DC

## 2013-12-11 NOTE — Progress Notes (Signed)
[facility] [address] [place]  [address] [phone]  Date:  12/11/2013   Name:  Joe Farrell   DOB:  1970/11/25   MRN:  [id number]  PCP:  Leonard Downing, MD    Chief Complaint: Medication Refill   History of Present Illness:  Joe Farrell is a 43 y.o. very pleasant male patient who presents with the following:  Just out of substance abuse program 2 months.  Was on prozac on discharge and has been out for two weeks.  Claims sobriety and compliance with treatment.  Requests a refill on his medications.  Denies other complaint or health concern today. No suicidal thoughts or thoughts of self harm.  Patient Active Problem List   Diagnosis Date Noted  . Substance induced mood disorder 10/14/2013  . Cocaine dependence 10/14/2013  . Alcohol abuse 10/14/2013    Past Medical History  Diagnosis Date  . Anxiety   . Depression     Past Surgical History  Procedure Laterality Date  . Wisdom tooth extraction    . Vasectomy      History  Substance Use Topics  . Smoking status: Current Every Day Smoker -- 1.00 packs/day    Types: Cigarettes  . Smokeless tobacco: Never Used  . Alcohol Use: No     Comment: occasionally    Family History  Problem Relation Age of Onset  . Hyperlipidemia Mother     No Known Allergies  Medication list has been reviewed and updated.  No current outpatient prescriptions on file prior to visit.   No current facility-administered medications on file prior to visit.    Review of Systems:  As per HPI, otherwise negative.    Physical Examination: Filed Vitals:   12/11/13 1021  BP: 124/80  Pulse: 78  Temp: 98.1 F (36.7 C)  Resp: 16   Filed Vitals:   12/11/13 1021  Height: 6' 2.75" (1.899 m)  Weight: 198 lb 12.8 oz (90.175 kg)   Body mass index is 25.01 kg/(m^2). Ideal Body Weight: Weight in (lb) to have BMI = 25: 198.3  GEN: WDWN, NAD, Non-toxic, A & O x 3 HEENT: Atraumatic, Normocephalic.  Neck supple. No masses, No LAD. Ears and Nose: No external deformity. CV: RRR, No M/G/R. No JVD. No thrill. No extra heart sounds. PULM: CTA B, no wheezes, crackles, rhonchi. No retractions. No resp. distress. No accessory muscle use. ABD: S, NT, ND, +BS. No rebound. No HSM. EXTR: No c/c/e NEURO Normal gait.  PSYCH: Normally interactive. Conversant. Not depressed or anxious appearing.  Calm demeanor.    Assessment and Plan: Substance induced mood disorder Refill meds  Signed,  Ellison Carwin, MD

## 2013-12-11 NOTE — Patient Instructions (Signed)

## 2015-06-28 ENCOUNTER — Ambulatory Visit (INDEPENDENT_AMBULATORY_CARE_PROVIDER_SITE_OTHER): Payer: 59 | Admitting: Family Medicine

## 2015-06-28 VITALS — BP 132/80 | HR 84 | Temp 98.0°F | Resp 16 | Ht 74.0 in | Wt 205.4 lb

## 2015-06-28 DIAGNOSIS — R05 Cough: Secondary | ICD-10-CM

## 2015-06-28 DIAGNOSIS — R062 Wheezing: Secondary | ICD-10-CM

## 2015-06-28 DIAGNOSIS — J208 Acute bronchitis due to other specified organisms: Secondary | ICD-10-CM

## 2015-06-28 DIAGNOSIS — R059 Cough, unspecified: Secondary | ICD-10-CM

## 2015-06-28 LAB — POCT INFLUENZA A/B
Influenza A, POC: NEGATIVE
Influenza B, POC: NEGATIVE

## 2015-06-28 MED ORDER — BENZONATATE 100 MG PO CAPS: 100.0000 mg | ORAL_CAPSULE | Freq: Three times a day (TID) | ORAL | Status: DC | PRN

## 2015-06-28 MED ORDER — AZITHROMYCIN 250 MG PO TABS: ORAL_TABLET | ORAL | Status: DC

## 2015-06-28 MED ORDER — ALBUTEROL SULFATE HFA 108 (90 BASE) MCG/ACT IN AERS: 2.0000 | INHALATION_SPRAY | Freq: Four times a day (QID) | RESPIRATORY_TRACT | Status: DC | PRN

## 2015-06-28 NOTE — Patient Instructions (Signed)
We are going to treat you for bronchitis with azithromycin, tessalon perles as needed for cough, and inhaler as needed for cough and wheezing  Let us know if you do not feel better in the next few days- Sooner if worse.  When you are feeling better and done with the antibiotic you should have your annual flu shot.

## 2015-06-28 NOTE — Progress Notes (Signed)
[facility] [address], [place] [address] [phone]  Date:  06/28/2015   Name:  Joe Farrell   DOB:  09-01-1970   MRN:  [id number]  PCP:  Leonard Downing, MD    Chief Complaint: stuffy head and Cough   History of Present Illness:  Joe Farrell is a 44 y.o. very pleasant male patient who presents with the following:  Last seen by our office in summer 2015.   He does have a history of cocaine and alcohol abuse.  He has been coughing a lot and feeling tired- he has noted this for about a week now.   The cough can be productive.    He quit smoking about 5 weeks ago He has not noted a fever- might have felt warm on occasion No GI symptoms He has noted some sore throat- he thought this might be due to cough.  No earache.  Cough is much worse at night He is having some wheezing at night- minor  He is taking anti-pyretics. He took the last dose last night   Patient Active Problem List   Diagnosis Date Noted  . Substance induced mood disorder ([place]) 10/14/2013  . Cocaine dependence ([place]) 10/14/2013  . Alcohol abuse 10/14/2013    Past Medical History  Diagnosis Date  . Anxiety   . Depression     Past Surgical History  Procedure Laterality Date  . Wisdom tooth extraction    . Vasectomy      Social History  Substance Use Topics  . Smoking status: Former Smoker -- 1.00 packs/day    Types: Cigarettes    Quit date: 05/21/2015  . Smokeless tobacco: Never Used  . Alcohol Use: No     Comment: occasionally    Family History  Problem Relation Age of Onset  . Hyperlipidemia Mother     No Known Allergies  Medication list has been reviewed and updated.  Current Outpatient Prescriptions on File Prior to Visit  Medication Sig Dispense Refill  . FLUoxetine (PROZAC) 20 MG capsule Take 1 capsule (20 mg total) by mouth daily. (Patient not taking: Reported on 06/28/2015) 90 capsule 3   No current facility-administered medications on file  prior to visit.    Review of Systems:  As per HPI- otherwise negative.   Physical Examination: Filed Vitals:   06/28/15 1355  BP: 132/80  Pulse: 84  Temp: 98 F (36.7 C)  Resp: 16   Filed Vitals:   06/28/15 1355  Height: 6' 2"  (1.88 m)  Weight: 205 lb 6.4 oz (93.169 kg)   Body mass index is 26.36 kg/(m^2). Ideal Body Weight: Weight in (lb) to have BMI = 25: 194.3  GEN: WDWN, NAD, Non-toxic, A & O x 3, looks well HEENT: Atraumatic, Normocephalic. Neck supple. No masses, No LAD.  Bilateral TM wnl, oropharynx normal.  PEERL,EOMI.   Ears and Nose: No external deformity. CV: RRR, No M/G/R. No JVD. No thrill. No extra heart sounds. PULM: CTA B, no crackles, rhonchi. No retractions. No resp. distress. No accessory muscle use. Minimal wheezing on right only ABD: S, NT, ND, +BS. No rebound. No HSM. EXTR: No c/c/e NEURO Normal gait.  PSYCH: Normally interactive. Conversant. Not depressed or anxious appearing.  Calm demeanor.   Results for orders placed or performed in visit on 06/28/15  POCT Influenza A/B  Result Value Ref Range   Influenza A, POC Negative Negative   Influenza B, POC Negative Negative    Assessment  and Plan: Acute bronchitis due to other specified organisms - Plan: azithromycin (ZITHROMAX) 250 MG tablet  Cough - Plan: POCT Influenza A/B, benzonatate (TESSALON) 100 MG capsule  Wheezing - Plan: POCT Influenza A/B, albuterol (PROVENTIL HFA;VENTOLIN HFA) 108 (90 Base) MCG/ACT inhaler  Treat for bronchitis, cough and wheezing as above.  Will defer flu shot until he is feeling a bit better  Signed Lamar Blinks, MD

## 2015-07-27 ENCOUNTER — Ambulatory Visit (INDEPENDENT_AMBULATORY_CARE_PROVIDER_SITE_OTHER): Payer: 59 | Admitting: Physician Assistant

## 2015-07-27 ENCOUNTER — Encounter: Payer: Self-pay | Admitting: Physician Assistant

## 2015-07-27 VITALS — BP 114/86 | HR 85 | Temp 98.3°F | Resp 16 | Ht 73.25 in | Wt 207.4 lb

## 2015-07-27 DIAGNOSIS — Z87891 Personal history of nicotine dependence: Secondary | ICD-10-CM | POA: Diagnosis not present

## 2015-07-27 DIAGNOSIS — F329 Major depressive disorder, single episode, unspecified: Secondary | ICD-10-CM

## 2015-07-27 DIAGNOSIS — F1910 Other psychoactive substance abuse, uncomplicated: Secondary | ICD-10-CM

## 2015-07-27 DIAGNOSIS — F1911 Other psychoactive substance abuse, in remission: Secondary | ICD-10-CM

## 2015-07-27 DIAGNOSIS — Z23 Encounter for immunization: Secondary | ICD-10-CM | POA: Diagnosis not present

## 2015-07-27 DIAGNOSIS — F32A Depression, unspecified: Secondary | ICD-10-CM

## 2015-07-27 MED ORDER — FLUOXETINE HCL 20 MG PO CAPS: 20.0000 mg | ORAL_CAPSULE | Freq: Every day | ORAL | Status: DC

## 2015-07-27 NOTE — Progress Notes (Signed)
[facility] [address] [place] [address] [phone]  Date:  07/27/2015   Name:  Joe Farrell   DOB:  [birth year]   MRN:  [id number]  PCP:  Leonard Downing, MD    Chief Complaint: estab care and depression score during triage   History of Present Illness:  This is a 45 y.o. male with PMH hx substance abuse in remission, hx depression who is presenting to establish care. Remission from alcohol and cocaine abuse for 2 years. States he has had problems with substance use off and on since early 20s. Quit tob use 3 months ago. Doing well with this. His wife smokes but he states this does not tempt him. He quit cold [place]. Not sleeping well currently. Feeling very depressed. States he has issues with depression off and on. He has not had problems with his mood in the past 2 years until 1 month ago. Nothing triggered. He has been on prozac in the past and has helped. Not anxious. He feels less motivated to do things. He is having fleeting suicidal thoughts but no plan for suicide. He had 2 shots of liquor 1 week ago and this really scared him and his wife. None since. Has appt with therapist 3/27. He goes to [employer].  He is going on honeymoon with his wife in 1 week. They got married oct 2016. He has been married once before. He has two kids. His son lives with him. His daughter lives with his ex-wife.  He exercises regularly, even in this episode of depression.  Wants flu shot today.  Review of Systems:  Review of Systems See HPI  Patient Active Problem List   Diagnosis Date Noted  . Substance induced mood disorder ([place]) 10/14/2013  . Cocaine dependence ([place]) 10/14/2013  . Alcohol abuse 10/14/2013    Home meds: none  No Known Allergies  Past Surgical History  Procedure Laterality Date  . Wisdom tooth extraction    . Vasectomy      Social History  Substance Use Topics  . Smoking status: Former Smoker -- 1.00 packs/day   Types: Cigarettes    Quit date: 05/21/2015  . Smokeless tobacco: Never Used  . Alcohol Use: No     Comment: occasionally    Family History  Problem Relation Age of Onset  . Hyperlipidemia Mother     Medication list has been reviewed and updated.  Physical Examination:  Physical Exam  Constitutional: He is oriented to person, place, and time. He appears well-developed and well-nourished. No distress.  HENT:  Head: Normocephalic and atraumatic.  Right Ear: Hearing normal.  Left Ear: Hearing normal.  Nose: Nose normal.  Eyes: Conjunctivae and lids are normal. Right eye exhibits no discharge. Left eye exhibits no discharge. No scleral icterus.  Cardiovascular: Normal rate, regular rhythm, normal heart sounds and normal pulses.   No murmur heard. Pulmonary/Chest: Effort normal and breath sounds normal. No respiratory distress. He has no wheezes. He has no rhonchi. He has no rales.  Musculoskeletal: Normal range of motion.  Neurological: He is alert and oriented to person, place, and time.  Skin: Skin is warm, dry and intact. No lesion and no rash noted.  Psychiatric: He has a normal mood and affect. His speech is normal and behavior is normal. Thought content normal.    BP 114/86 mmHg  Pulse 85  Temp(Src) 98.3 F (36.8 C) (Oral)  Resp 16  Ht 6' 1.25" (1.861 m)  Wt  207 lb 6.4 oz (94.076 kg)  BMI 27.16 kg/m2  SpO2 98%  Depression screen [facility] 2/9 07/27/2015 07/27/2015 06/28/2015  Decreased Interest 1 0 0  Down, Depressed, Hopeless 2 0 0  PHQ - 2 Score 3 0 0  Altered sleeping 2 - -  Tired, decreased energy 2 - -  Change in appetite 1 - -  Feeling bad or failure about yourself  2 - -  Trouble concentrating 1 - -  Moving slowly or fidgety/restless 3 - -  Suicidal thoughts 2 - -  PHQ-9 Score 16 - -  Difficult doing work/chores Somewhat difficult - -    Assessment and Plan:  1. Depression Current depressive episode, duration 1 month. Fleeting suicidal thoughts but no plan.  Will start back on prozac 20 mg QD. He has appt with therapy in 2 weeks. We discussed exercise and meditation. Return or go to ED immediately if suicidal thoughts worsen. Return in 8 weeks for follow up and CPE. - FLUoxetine (PROZAC) 20 MG capsule; Take 1 capsule (20 mg total) by mouth daily.  Dispense: 30 capsule; Refill: 5  2. Substance abuse in remission In remission. Continue NA meetings.  3. Former tobacco use 3 months since quit. Congratulated on efforts.  4. Needs flu shot - Flu Vaccine QUAD 36+ mos IM   Benjaman Pott. Drenda Freeze, MHS [facility]  07/29/2015

## 2015-07-27 NOTE — Patient Instructions (Signed)
Take prozac 1/2 tab a day for 5 days, then increase to full tab thereafter. If you start having worsening suicidal thoughts, go to the ED immediately. Follow up with therapist. Regular exercise and meditation can help improve your mood. Return at your convenience for complete physical and lab work.

## 2015-07-29 DIAGNOSIS — F1911 Other psychoactive substance abuse, in remission: Secondary | ICD-10-CM | POA: Insufficient documentation

## 2015-07-29 DIAGNOSIS — F32A Depression, unspecified: Secondary | ICD-10-CM | POA: Insufficient documentation

## 2015-07-29 DIAGNOSIS — Z87891 Personal history of nicotine dependence: Secondary | ICD-10-CM | POA: Insufficient documentation

## 2015-07-29 DIAGNOSIS — F329 Major depressive disorder, single episode, unspecified: Secondary | ICD-10-CM | POA: Insufficient documentation

## 2015-10-19 ENCOUNTER — Ambulatory Visit (INDEPENDENT_AMBULATORY_CARE_PROVIDER_SITE_OTHER): Payer: 59 | Admitting: Family Medicine

## 2015-10-19 ENCOUNTER — Encounter: Payer: Self-pay | Admitting: Family Medicine

## 2015-10-19 VITALS — BP 115/62 | HR 77 | Temp 98.7°F | Resp 16 | Ht 73.0 in | Wt 209.6 lb

## 2015-10-19 DIAGNOSIS — F329 Major depressive disorder, single episode, unspecified: Secondary | ICD-10-CM | POA: Diagnosis not present

## 2015-10-19 DIAGNOSIS — F17200 Nicotine dependence, unspecified, uncomplicated: Secondary | ICD-10-CM

## 2015-10-19 DIAGNOSIS — Z6827 Body mass index (BMI) 27.0-27.9, adult: Secondary | ICD-10-CM

## 2015-10-19 DIAGNOSIS — Z0000 Encounter for general adult medical examination without abnormal findings: Secondary | ICD-10-CM

## 2015-10-19 DIAGNOSIS — F32A Depression, unspecified: Secondary | ICD-10-CM

## 2015-10-19 LAB — COMPLETE METABOLIC PANEL WITH GFR
ALBUMIN: 4.1 g/dL (ref 3.6–5.1)
ALK PHOS: 95 U/L (ref 40–115)
ALT: 27 U/L (ref 9–46)
AST: 23 U/L (ref 10–40)
BUN: 11 mg/dL (ref 7–25)
CALCIUM: 9.6 mg/dL (ref 8.6–10.3)
CO2: 24 mmol/L (ref 20–31)
Chloride: 107 mmol/L (ref 98–110)
Creat: 0.96 mg/dL (ref 0.60–1.35)
GFR, Est Non African American: >89 mL/min (ref >=60)
Glucose, Bld: 92 mg/dL (ref 65–99)
POTASSIUM: 4.6 mmol/L (ref 3.5–5.3)
Sodium: 139 mmol/L (ref 135–146)
Total Bilirubin: 0.4 mg/dL (ref 0.2–1.2)
Total Protein: 6.5 g/dL (ref 6.1–8.1)

## 2015-10-19 LAB — CBC
HCT: 49.1 % (ref 38.5–50.0)
Hemoglobin: 16.8 g/dL (ref 13.2–17.1)
MCH: 32.9 pg (ref 27.0–33.0)
MCHC: 34.2 g/dL (ref 32.0–36.0)
MCV: 96.1 fL (ref 80.0–100.0)
MPV: 11.7 fL (ref 7.5–12.5)
PLATELETS: 262 10*3/uL (ref 140–400)
RBC: 5.11 MIL/uL (ref 4.20–5.80)
RDW: 13.5 % (ref 11.0–15.0)
WBC: 7.8 10*3/uL (ref 3.8–10.8)

## 2015-10-19 LAB — LIPID PANEL
CHOLESTEROL: 214 mg/dL — AB (ref 125–200)
HDL: 36 mg/dL — ABNORMAL LOW (ref >=40)
LDL Cholesterol: 151 mg/dL — ABNORMAL HIGH (ref <130)
TRIGLYCERIDES: 133 mg/dL (ref <150)
Total CHOL/HDL Ratio: 5.9 Ratio — ABNORMAL HIGH (ref <=5.0)
VLDL: 27 mg/dL (ref <30)

## 2015-10-19 LAB — TSH: TSH: 1.79 m[IU]/L (ref 0.40–4.50)

## 2015-10-19 NOTE — Patient Instructions (Signed)
Please call or go on Mychart to schedule a follow up appointment in November/December  Let me know if you would like to increase your fluoxetine dose      Why follow it? Research shows. . Those who follow the Mediterranean diet have a reduced risk of heart disease  . The diet is associated with a reduced incidence of Parkinson's and Alzheimer's diseases . People following the diet may have longer life expectancies and lower rates of chronic diseases  . The Dietary Guidelines for Americans recommends the Mediterranean diet as an eating plan to promote health and prevent disease  What Is the Mediterranean Diet?  . Healthy eating plan based on typical foods and recipes of Mediterranean-style cooking . The diet is primarily a plant based diet; these foods should make up a majority of meals   Starches - Plant based foods should make up a majority of meals - They are an important sources of vitamins, minerals, energy, antioxidants, and fiber - Choose whole grains, foods high in fiber and minimally processed items  - Typical grain sources include wheat, oats, barley, corn, brown rice, bulgar, farro, millet, polenta, couscous  - Various types of beans include chickpeas, lentils, fava beans, black beans, white beans   Fruits  Veggies - Large quantities of antioxidant rich fruits & veggies; 6 or more servings  - Vegetables can be eaten raw or lightly drizzled with oil and cooked  - Vegetables common to the traditional Mediterranean Diet include: artichokes, arugula, beets, broccoli, brussel sprouts, cabbage, carrots, celery, collard greens, cucumbers, eggplant, kale, leeks, lemons, lettuce, mushrooms, okra, onions, peas, peppers, potatoes, pumpkin, radishes, rutabaga, shallots, spinach, sweet potatoes, turnips, zucchini - Fruits common to the Mediterranean Diet include: apples, apricots, avocados, cherries, clementines, dates, figs, grapefruits, grapes, melons, nectarines, oranges, peaches, pears,  pomegranates, strawberries, tangerines  Fats - Replace butter and margarine with healthy oils, such as olive oil, canola oil, and tahini  - Limit nuts to no more than a handful a day  - Nuts include walnuts, almonds, pecans, pistachios, pine nuts  - Limit or avoid candied, honey roasted or heavily salted nuts - Olives are central to the [employer] - can be eaten whole or used in a variety of dishes   Meats Protein - Limiting red meat: no more than a few times a month - When eating red meat: choose lean cuts and keep the portion to the size of deck of cards - Eggs: approx. 0 to 4 times a week  - Fish and lean poultry: at least 2 a week  - Healthy protein sources include, chicken, [place], lean beef, lamb - Increase intake of seafood such as tuna, salmon, trout, mackerel, shrimp, scallops - Avoid or limit high fat processed meats such as sausage and bacon  Dairy - Include moderate amounts of low fat dairy products  - Focus on healthy dairy such as fat free yogurt, skim milk, low or reduced fat cheese - Limit dairy products higher in fat such as whole or 2% milk, cheese, ice cream  Alcohol - Moderate amounts of red wine is ok  - No more than 5 oz daily for women (all ages) and men older than age 40  - No more than 10 oz of wine daily for men younger than 74  Other - Limit sweets and other desserts  - Use herbs and spices instead of salt to flavor foods  - Herbs and spices common to the traditional Mediterranean Diet include: basil, bay leaves, chives,  cloves, cumin, fennel, garlic, lavender, marjoram, mint, oregano, parsley, pepper, rosemary, sage, savory, sumac, tarragon, thyme   It's not just a diet, it's a lifestyle:  . The Mediterranean diet includes lifestyle factors typical of those in the region  . Foods, drinks and meals are best eaten with others and savored . Daily physical activity is important for overall good health . This could be strenuous exercise like running and  aerobics . This could also be more leisurely activities such as walking, housework, yard-work, or taking the stairs . Moderation is the key; a balanced and healthy diet accommodates most foods and drinks . Consider portion sizes and frequency of consumption of certain foods   Meal Ideas & Options:  . Breakfast:  o Whole wheat toast or whole wheat English muffins with peanut butter & hard boiled egg o Steel cut oats topped with apples & cinnamon and skim milk  o Fresh fruit: banana, strawberries, melon, berries, peaches  o Smoothies: strawberries, bananas, greek yogurt, peanut butter o Low fat greek yogurt with blueberries and granola  o Egg white omelet with spinach and mushrooms o Breakfast couscous: whole wheat couscous, apricots, skim milk, cranberries  . Sandwiches:  o Hummus and grilled vegetables (peppers, zucchini, squash) on whole wheat bread   o Grilled chicken on whole wheat pita with lettuce, tomatoes, cucumbers or tzatziki  o Tuna salad on whole wheat bread: tuna salad made with greek yogurt, olives, red peppers, capers, green onions o Garlic rosemary lamb pita: lamb sauted with garlic, rosemary, salt & pepper; add lettuce, cucumber, greek yogurt to pita - flavor with lemon juice and black pepper  . Seafood:  o Mediterranean grilled salmon, seasoned with garlic, basil, parsley, lemon juice and black pepper o Shrimp, lemon, and spinach whole-grain pasta salad made with low fat greek yogurt  o Seared scallops with lemon orzo  o Seared tuna steaks seasoned salt, pepper, coriander topped with tomato mixture of olives, tomatoes, olive oil, minced garlic, parsley, green onions and cappers  . Meats:  o Herbed greek chicken salad with kalamata olives, cucumber, feta  o Red bell peppers stuffed with spinach, bulgur, lean ground beef (or lentils) & topped with feta   o Kebabs: skewers of chicken, tomatoes, onions, zucchini, squash  o [place] burgers: made with red onions, mint, dill,  lemon juice, feta cheese topped with roasted red peppers . Vegetarian o Cucumber salad: cucumbers, artichoke hearts, celery, red onion, feta cheese, tossed in olive oil & lemon juice  o Hummus and whole grain pita points with a greek salad (lettuce, tomato, feta, olives, cucumbers, red onion) o Lentil soup with celery, carrots made with vegetable broth, garlic, salt and pepper  o Tabouli salad: parsley, bulgur, mint, scallions, cucumbers, tomato, radishes, lemon juice, olive oil, salt and pepper.       Keeping you healthy  Get these tests  Blood pressure- Have your blood pressure checked once a year by your healthcare provider.  Normal blood pressure is 120/80.  Weight- Have your body mass index (BMI) calculated to screen for obesity.  BMI is a measure of body fat based on height and weight. You can also calculate your own BMI at [employer]velBags.it.  Cholesterol- Have your cholesterol checked regularly starting at age 58, sooner may be necessary if you have diabetes, high blood pressure, if a family member developed heart diseases at an early age or if you smoke.   Chlamydia, HIV, and other sexual transmitted disease- Get screened each year until the age  of 25 then within three months of each new sexual partner.  Diabetes- Have your blood sugar checked regularly if you have high blood pressure, high cholesterol, a family history of diabetes or if you are overweight.  Get these vaccines  Flu shot- Every fall.  Tetanus shot- Every 10 years.  Menactra- Single dose; prevents meningitis.  Take these steps  Don't smoke- If you do smoke, ask your healthcare provider about quitting. For tips on how to quit, go to www.smokefree.gov or call 1-800-QUIT-NOW.  Be physically active- Exercise 5 days a week for at least 30 minutes.  If you are not already physically active start slow and gradually work up to 30 minutes of moderate physical activity.  Examples of moderate activity include  walking briskly, mowing the yard, dancing, swimming bicycling, etc.  Eat a healthy diet- Eat a variety of healthy foods such as fruits, vegetables, low fat milk, low fat cheese, yogurt, lean meats, poultry, fish, beans, tofu, etc.  For more information on healthy eating, go to www.thenutritionsource.org  Drink alcohol in moderation- Limit alcohol intake two drinks or less a day.  Never drink and drive.  Dentist- Brush and floss teeth twice daily; visit your dentis twice a year.  Depression-Your emotional health is as important as your physical health.  If you're feeling down, losing interest in things you normally enjoy please talk with your healthcare provider.  Gun Safety- If you keep a gun in your home, keep it unloaded and with the safety lock on.  Bullets should be stored separately.  Helmet use- Always wear a helmet when riding a motorcycle, bicycle, rollerblading or skateboarding.  Safe sex- If you may be exposed to a sexually transmitted infection, use a condom  Seat belts- Seat bels can save your life; always wear one.  Smoke/Carbon Monoxide detectors- These detectors need to be installed on the appropriate level of your home.  Replace batteries at least once a year.  Skin Cancer- When out in the sun, cover up and use sunscreen SPF 15 or higher.  Violence- If anyone is threatening or hurting you, please tell your healthcare provider.   IF you received an x-ray today, you will receive an invoice from [facility]. Please contact [facility] at [phone] with questions or concerns regarding your invoice.   IF you received labwork today, you will receive an invoice from [employer]. Please contact [facility] at [phone] with questions or concerns regarding your invoice.   Our billing staff will not be able to assist you with questions regarding bills from these companies.  You will be contacted with the lab results as soon as they  are available. The fastest way to get your results is to activate your My Chart account. Instructions are located on the last page of this paperwork. If you have not heard from [place] regarding the results in 2 weeks, please contact this office.

## 2015-10-19 NOTE — Progress Notes (Signed)
Subjective:    Patient ID: Joe Farrell, male    DOB: 1971-03-05, 45 y.o.   MRN: [id number]  HPI This is a 45 yo male who presents today for CPE. He is married (wife is a Marine scientist) and has two children. 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   Last CPE- years ago  Flu- 07/27/15 Dental- rarely, has only 3 teeth Eye- nor in may years Exercise- several times a week, cardio and weights  He was seen 07/27/15 and started on prozac. He is doing better Going to therapy, now monthly, which helps "some."  Eats out for lunch daily. Sometimes fast food.   Past Medical History  Diagnosis Date  . Anxiety   . Depression    Past Surgical History  Procedure Laterality Date  . Wisdom tooth extraction    . Vasectomy     Family History  Problem Relation Age of Onset  . Hyperlipidemia Mother    Social History  Substance Use Topics  . Smoking status: Current Every Day Smoker -- 1.00 packs/day    Types: Cigarettes    Last Attempt to Quit: 05/21/2015  . Smokeless tobacco: Never Used  . Alcohol Use: No     Comment: occasionally     Review of Systems  Constitutional: Negative.   HENT: Negative.   Eyes: Negative.   Respiratory: Negative.   Cardiovascular: Negative.   Gastrointestinal: Negative.   Endocrine: Negative.   Genitourinary: Negative.   Musculoskeletal: Negative.   Skin: Negative.   Allergic/Immunologic: Negative.   Neurological: Negative.   Hematological: Negative.   Psychiatric/Behavioral: Positive for dysphoric mood (occasionally).       Objective:   Physical Exam Physical Exam  Constitutional: He is oriented to person, place, and time. He appears well-developed and well-nourished.  HENT:  Head: Normocephalic and atraumatic.  Right Ear: External ear normal.  Left Ear: External ear normal.  Nose: Nose normal.  Mouth/Throat: Oropharynx is clear and moist.  Eyes: Conjunctivae are normal. Pupils are equal, round, and reactive to light.  Neck: Normal range of motion.  Neck supple.  Cardiovascular: Normal rate, regular rhythm, normal heart sounds and intact distal pulses.   Pulmonary/Chest: Effort normal and breath sounds normal.  Abdominal: Soft. Bowel sounds are normal. Hernia confirmed negative in the right inguinal area and confirmed negative in the left inguinal area.  Genitourinary: Testes normal and penis normal. Circumcised.  Musculoskeletal: Normal range of motion. He exhibits no edema or tenderness.       Cervical back: Normal.       Thoracic back: Normal.       Lumbar back: Normal.  Lymphadenopathy:    He has no cervical adenopathy.       Right: No inguinal adenopathy present.       Left: No inguinal adenopathy present.  Neurological: He is alert and oriented to person, place, and time. He has normal reflexes.  Skin: Skin is warm and dry.  Psychiatric: He has a normal mood and affect. His behavior is normal. Judgment normal.  Vitals reviewed.  BP 115/62 mmHg  Pulse 77  Temp(Src) 98.7 F (37.1 C) (Oral)  Resp 16  Ht 6' 1"  (1.854 m)  Wt 209 lb 9.6 oz (95.074 kg)  BMI 27.66 kg/m2 Wt Readings from Last 3 Encounters:  10/19/15 209 lb 9.6 oz (95.074 kg)  07/27/15 207 lb 6.4 oz (94.076 kg)  06/28/15 205 lb 6.4 oz (93.169 kg)   Depression screen [facility] 2/9 10/19/2015 07/27/2015 07/27/2015 06/28/2015  Decreased Interest 0 1 0 0  Down, Depressed, Hopeless 0 2 0 0  PHQ - 2 Score 0 3 0 0  Altered sleeping - 2 - -  Tired, decreased energy - 2 - -  Change in appetite - 1 - -  Feeling bad or failure about yourself  - 2 - -  Trouble concentrating - 1 - -  Moving slowly or fidgety/restless - 3 - -  Suicidal thoughts - 2 - -  PHQ-9 Score - 16 - -  Difficult doing work/chores - Somewhat difficult - -        Assessment & Plan:  1. Annual physical exam -- Discussed and encouraged healthy lifestyle choices- adequate sleep, regular exercise, stress management and healthy food choices.   2. Tobacco use disorder - he has switched from cigarettes to  cigars. Encouraged complete smoking cessation.   3. Depression - continue fluoxetine 20 mg, discussed increasing the dose, he would like to stay at 20 mg - follow up in 6 months, sooner if worsening symptoms - TSH  4. BMI 27.0-27.9,adult - CBC - Lipid panel - COMPLETE METABOLIC PANEL WITH GFR - TSH   Clarene Reamer, FNP-BC  [facility]  10/19/2015 3:08 PM

## 2015-10-24 ENCOUNTER — Encounter: Payer: Self-pay | Admitting: Family Medicine

## 2016-03-30 ENCOUNTER — Ambulatory Visit (INDEPENDENT_AMBULATORY_CARE_PROVIDER_SITE_OTHER): Payer: Managed Care, Other (non HMO) | Admitting: Family Medicine

## 2016-03-30 ENCOUNTER — Encounter: Payer: Managed Care, Other (non HMO) | Admitting: Physician Assistant

## 2016-03-30 VITALS — BP 124/86 | HR 83 | Temp 98.7°F | Resp 16 | Ht 73.5 in | Wt 196.0 lb

## 2016-03-30 DIAGNOSIS — L0291 Cutaneous abscess, unspecified: Secondary | ICD-10-CM | POA: Diagnosis not present

## 2016-03-30 DIAGNOSIS — Z23 Encounter for immunization: Secondary | ICD-10-CM

## 2016-03-30 MED ORDER — HYDROXYZINE HCL 25 MG PO TABS: 12.5000 mg | ORAL_TABLET | Freq: Three times a day (TID) | ORAL | 0 refills | Status: DC | PRN

## 2016-03-30 MED ORDER — DOXYCYCLINE HYCLATE 100 MG PO CAPS: 100.0000 mg | ORAL_CAPSULE | Freq: Two times a day (BID) | ORAL | 0 refills | Status: DC

## 2016-03-30 MED ORDER — CEFTRIAXONE SODIUM 500 MG IJ SOLR: 500.0000 mg | Freq: Once | INTRAMUSCULAR | Status: DC

## 2016-03-30 MED ORDER — CEFTRIAXONE SODIUM 1 G IJ SOLR
1.0000 g | Freq: Once | INTRAMUSCULAR | Status: AC
  Administered 2016-03-30: 1 g via INTRAMUSCULAR

## 2016-03-30 NOTE — Patient Instructions (Addendum)
Return tomorrow for wound recheck. Keep bandage on face until re-evaluated tomorrow.  Start Doxycyline 100 mg twice daily for 10 days for abscess.    IF you received an x-ray today, you will receive an invoice from [facility]. Please contact [facility] at [phone] with questions or concerns regarding your invoice.   IF you received labwork today, you will receive an invoice from [employer]. Please contact [facility] at [phone] with questions or concerns regarding your invoice.   Our billing staff will not be able to assist you with questions regarding bills from these companies.  You will be contacted with the lab results as soon as they are available. The fastest way to get your results is to activate your My Chart account. Instructions are located on the last page of this paperwork. If you have not heard from [place] regarding the results in 2 weeks, please contact this office.      Abscess An abscess (boil or furuncle) is an infected area on or under the skin. This area is filled with yellowish-white fluid (pus) and other material (debris). HOME CARE   Only take medicines as told by your doctor.  If you were given antibiotic medicine, take it as directed. Finish the medicine even if you start to feel better.  If gauze is used, follow your doctor's directions for changing the gauze.  To avoid spreading the infection:  Keep your abscess covered with a bandage.  Wash your hands well.  Do not share personal care items, towels, or whirlpools with others.  Avoid skin contact with others.  Keep your skin and clothes clean around the abscess.  Keep all doctor visits as told. GET HELP RIGHT AWAY IF:   You have more pain, puffiness (swelling), or redness in the wound site.  You have more fluid or blood coming from the wound site.  You have muscle aches, chills, or you feel sick.  You have a fever. MAKE SURE YOU:   Understand  these instructions.  Will watch your condition.  Will get help right away if you are not doing well or get worse.   This information is not intended to replace advice given to you by your health care provider. Make sure you discuss any questions you have with your health care provider.   Document Released: 10/25/2007 Document Revised: 11/07/2011 Document Reviewed: 07/22/2011 Elsevier Interactive Patient Education [employer].

## 2016-03-30 NOTE — Progress Notes (Signed)
   Patient ID: Joe Farrell, male    DOB: 12/23/1970, 45 y.o.   MRN: [id number]  PCP: Leonard Downing, MD  Chief Complaint  Patient presents with  . Mass    right side of face, x 6 days, gettings worse    Subjective:   HPI 45 year old male presents for evaluation of mass on right side of face times 6 days.  Patient is familiar to [facility]. He reports initial soreness with subsequent increase in swelling with redness. He reports pressing on the mass, inserting a straight needle into the mass in an attempt to drain it. The pain became almost unbearable and therefore he presents today for further evaluation. He denies any fever, chills, nausea, or vomiting.  Social History   Social History  . Marital status: Married    Spouse name: N/A  . Number of children: N/A  . Years of education: N/A   Occupational History  . Not on file.   Social History Main Topics  . Smoking status: Current Every Day Smoker    Packs/day: 1.00    Types: Cigarettes    Last attempt to quit: 05/21/2015  . Smokeless tobacco: Never Used  . Alcohol use No     Comment: occasionally  . Drug use: No  . Sexual activity: Yes   Other Topics Concern  . Not on file   Social History Narrative  . No narrative on file   Family History  Problem Relation Age of Onset  . Hyperlipidemia Mother    Review of Systems See HPI   Patient Active Problem List   Diagnosis Date Noted  . 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 Depression 07/29/2015  . Former tobacco use 07/29/2015     Prior to Admission medications   Medication Sig Start Date End Date Taking? Authorizing Provider  FLUoxetine (PROZAC) 20 MG capsule Take 1 capsule (20 mg total) by mouth daily. Patient not taking: Reported on 03/30/2016 07/27/15   Ezekiel Slocumb, PA-C  No Known Allergies    Objective:  Physical Exam  Constitutional: He is oriented to person, place, and time. He appears well-developed and well-nourished.  HENT:  Head:  Normocephalic.    Eyes: Pupils are equal, round, and reactive to light.  Neck: Normal range of motion.  Cardiovascular: Normal rate, regular rhythm, normal heart sounds and intact distal pulses.   Pulmonary/Chest: Effort normal and breath sounds normal.  Musculoskeletal: Normal range of motion.  Neurological: He is alert and oriented to person, place, and time.  Skin: Skin is warm. There is erythema.  Psychiatric: He has a normal mood and affect. His behavior is normal. Judgment and thought content normal.   Procedure note: Drained Wound Abscess  Verbal informed consent obtained.  Local anesthesia used 2% with epinephrine. Cleaned with alcohol swab.Prepped with betadine  Incised with # 11. Large amount of purulent exudate mixed with serosanguineous debris was extracted. Sebaceous material unable to remove from wound bed as the middle of abscess is harden and stopped draining and patient experiencing pain with multiple attempted to express exudate from abscess.  Vitals:   03/30/16 1448  BP: 124/86  Pulse: 83  Resp: 16  Temp: 98.7 F (37.1 C)   Assessment & Plan:  1. Abscess - WOUND CULTURE - Ceftriaxone (ROCEPHIN) injection 1 g; Inject 1 g into the muscle once. -Doxycycline (Vibramycin) 100 mg twice daily for 10 days.  2. Need for influenza vaccination - Flu Vaccine QUAD 36+ mos IM

## 2016-03-31 ENCOUNTER — Ambulatory Visit (INDEPENDENT_AMBULATORY_CARE_PROVIDER_SITE_OTHER): Payer: Managed Care, Other (non HMO) | Admitting: Family Medicine

## 2016-03-31 VITALS — BP 128/86 | HR 90 | Temp 98.3°F | Resp 16 | Ht 73.5 in | Wt 196.0 lb

## 2016-03-31 DIAGNOSIS — Z5189 Encounter for other specified aftercare: Secondary | ICD-10-CM

## 2016-03-31 NOTE — Progress Notes (Signed)
Facial wound abscess   Left side of lower chin  Wound packing removed. Significant decrease in edema. Abscess continues to drain purulent drainage. Repacked wound. Redressed. Advised to continue antibiotic and return tomorrow for wound care and see [name] or [employer].   Carroll Sage. Kenton Kingfisher, MSN, FNP-C [facility]

## 2016-03-31 NOTE — Patient Instructions (Addendum)
Please see [name] or [name] tomorrow for [name].   WOUND CARE Please return in 1 days to have your stitches/staples removed or sooner if you have concerns. [name] Keep area clean and dry for 24 hours. Do not remove bandage, if applied. . After 24 hours, remove bandage and wash wound gently with mild soap and warm water. Reapply a new bandage after cleaning wound, if directed. . Continue daily cleansing with soap and water until stitches/staples are removed. . Do not apply any ointments or creams to the wound while stitches/staples are in place, as this may cause delayed healing. . Notify the office if you experience any of the following signs of infection: Swelling, redness, pus drainage, streaking, fever >101.0 F . Notify the office if you experience excessive bleeding that does not stop after 15-20 minutes of constant, firm pressure.     IF you received an x-ray today, you will receive an invoice from [facility]. Please contact [facility] at [phone] with questions or concerns regarding your invoice.   IF you received labwork today, you will receive an invoice from [employer]. Please contact [facility] at [phone] with questions or concerns regarding your invoice.   Our billing staff will not be able to assist you with questions regarding bills from these companies.  You will be contacted with the lab results as soon as they are available. The fastest way to get your results is to activate your My Chart account. Instructions are located on the last page of this paperwork. If you have not heard from [place] regarding the results in 2 weeks, please contact this office.

## 2016-04-01 ENCOUNTER — Ambulatory Visit (INDEPENDENT_AMBULATORY_CARE_PROVIDER_SITE_OTHER): Payer: Managed Care, Other (non HMO) | Admitting: Physician Assistant

## 2016-04-01 ENCOUNTER — Encounter: Payer: Self-pay | Admitting: Physician Assistant

## 2016-04-01 VITALS — BP 122/80 | HR 95 | Temp 98.2°F | Resp 16

## 2016-04-01 DIAGNOSIS — Z5189 Encounter for other specified aftercare: Secondary | ICD-10-CM

## 2016-04-01 NOTE — Progress Notes (Signed)
   04/01/2016 2:10 PM   DOB: 11/19/1970 / MRN: [id number]  SUBJECTIVE:  Joe Farrell is a well appearing 45 y.o. here today for wound care. He denies exquisite tenderness at the site of the wound, nausea, emesis, fever and chills.  He has been compliant with medical therapy and recommendations thus far.   He has No Known Allergies.   He  has a past medical history of Anxiety and Depression.    He  reports that he has been smoking Cigarettes.  He has been smoking about 1.00 pack per day. He has never used smokeless tobacco. He reports that he does not drink alcohol or use drugs. He  reports that he currently engages in sexual activity. The patient  has a past surgical history that includes Wisdom tooth extraction and Vasectomy.  His family history includes Hyperlipidemia in his mother.  Review of Systems  Constitutional: Negative for chills and fever.  Respiratory: Negative for cough.   Gastrointestinal: Negative for abdominal pain.  Musculoskeletal: Negative for myalgias.  Skin: Positive for rash. Negative for itching.  Neurological: Negative for dizziness.    Problem list and medications reviewed and updated by myself where necessary, and exist elsewhere in the encounter.   OBJECTIVE:  BP 122/80 (BP Location: Right Arm, Patient Position: Sitting, Cuff Size: Normal)   Pulse 95   Temp 98.2 F (36.8 C) (Oral)   Resp 16   SpO2 97%  CrCl cannot be calculated (Patient's most recent lab result is older than the maximum 21 days allowed.).  Physical Exam  Constitutional: He is oriented to person, place, and time. He appears well-developed and well-nourished. No distress.  HENT:  Head:    Left Ear: Tympanic membrane normal.  Cardiovascular: Normal rate and regular rhythm.   Pulmonary/Chest: Effort normal and breath sounds normal.  Musculoskeletal: Normal range of motion.  Neurological: He is alert and oriented to person, place, and time.  Skin: Skin is warm. He is not diaphoretic.      Results for orders placed or performed in visit on 03/30/16 (from the past 48 hour(s))  WOUND CULTURE     Status: None (Preliminary result)   Collection Time: 03/30/16  4:02 PM  Result Value Ref Range   Gram Stain Moderate    Gram Stain WBC present-both PMN and Mononuclear    Gram Stain No Squamous Epithelial Cells Seen    Gram Stain Moderate Gram Positive Cocci In Pairs    Preliminary Report Abundant STAPHYLOCOCCUS AUREUS     Comment: Rifampin and Gentamicin should not be used as single drugs for treatment of Staph infections. SUSCEPTIBILITY TEST REPORT TO FOLLOW     ASSESSMENT AND PLAN  Elaine was seen today for follow-up.  Diagnoses and all orders for this visit:  Encounter for wound re-check Comments: Advised that he leave the packing in for two days then pull.  No need to RTC.  Continue doxy.     The patient was advised to call or return to clinic if he does not see an improvement in symptoms or to seek the care of the closest emergency department if he worsens with the above plan.   [name], MHS, PA-C [facility] 04/01/2016 2:10 PM

## 2016-04-01 NOTE — Patient Instructions (Signed)
     IF you received an x-ray today, you will receive an invoice from [facility]. Please contact  Radiology at [phone] with questions or concerns regarding your invoice.   IF you received labwork today, you will receive an invoice from [facility]. Please contact [facility] at [phone] with questions or concerns regarding your invoice.   Our billing staff will not be able to assist you with questions regarding bills from these companies.  You will be contacted with the lab results as soon as they are available. The fastest way to get your results is to activate your My Chart account. Instructions are located on the last page of this paperwork. If you have not heard from us regarding the results in 2 weeks, please contact this office.      

## 2016-04-02 LAB — WOUND CULTURE: GRAM STAIN: NONE SEEN

## 2016-04-03 ENCOUNTER — Encounter: Payer: Self-pay | Admitting: Family Medicine

## 2016-04-04 DIAGNOSIS — L0291 Cutaneous abscess, unspecified: Secondary | ICD-10-CM | POA: Insufficient documentation

## 2016-04-04 NOTE — Telephone Encounter (Signed)
Kim, I don't see that you have reviewed pt's wound culture results yet. Please release them to Mychart once you have reviewed. I don't have any idea how to change the pt's chart to get rid of the "no show" for appt?

## 2016-04-06 NOTE — Telephone Encounter (Signed)
I spoke to Addie about getting the "no show" out of pt's chart. She was not sure how to do that but was kind enough to check with Larene Beach to see if it can be done.

## 2016-08-02 NOTE — Progress Notes (Signed)
This encounter was created in error - please disregard.

## 2017-05-08 ENCOUNTER — Other Ambulatory Visit: Payer: Self-pay | Admitting: Family Medicine

## 2017-05-09 ENCOUNTER — Other Ambulatory Visit: Payer: Self-pay

## 2017-05-09 NOTE — Telephone Encounter (Signed)
Does pt need to be seen in office before refill request can be granted; last visit 2017

## 2017-05-25 ENCOUNTER — Other Ambulatory Visit: Payer: Self-pay

## 2017-05-25 ENCOUNTER — Ambulatory Visit: Payer: Managed Care, Other (non HMO) | Admitting: Physician Assistant

## 2017-05-25 ENCOUNTER — Ambulatory Visit (INDEPENDENT_AMBULATORY_CARE_PROVIDER_SITE_OTHER): Payer: 59

## 2017-05-25 ENCOUNTER — Encounter: Payer: Self-pay | Admitting: Physician Assistant

## 2017-05-25 VITALS — BP 132/88 | HR 88 | Temp 98.7°F | Resp 16 | Ht 73.5 in | Wt 205.4 lb

## 2017-05-25 DIAGNOSIS — G8929 Other chronic pain: Secondary | ICD-10-CM

## 2017-05-25 DIAGNOSIS — Z23 Encounter for immunization: Secondary | ICD-10-CM

## 2017-05-25 DIAGNOSIS — M87059 Idiopathic aseptic necrosis of unspecified femur: Secondary | ICD-10-CM | POA: Diagnosis not present

## 2017-05-25 DIAGNOSIS — M25511 Pain in right shoulder: Secondary | ICD-10-CM

## 2017-05-25 DIAGNOSIS — F331 Major depressive disorder, recurrent, moderate: Secondary | ICD-10-CM | POA: Diagnosis not present

## 2017-05-25 LAB — POCT CBC
Granulocyte percent: 74.4 %G (ref 37–80)
HCT, POC: 50.4 % (ref 43.5–53.7)
HEMOGLOBIN: 17.3 g/dL (ref 14.1–18.1)
Lymph, poc: 2.8 (ref 0.6–3.4)
MCH: 31.8 pg — AB (ref 27–31.2)
MCHC: 34.3 g/dL (ref 31.8–35.4)
MCV: 92.7 fL (ref 80–97)
MID (cbc): 0.5 (ref 0–0.9)
MPV: 8.8 fL (ref 0–99.8)
POC GRANULOCYTE: 9.4 — AB (ref 2–6.9)
POC LYMPH PERCENT: 21.7 %L (ref 10–50)
POC MID %: 3.9 % (ref 0–12)
Platelet Count, POC: 281 10*3/uL (ref 142–424)
RBC: 5.44 M/uL (ref 4.69–6.13)
RDW, POC: 13.7 %
WBC: 12.7 10*3/uL — AB (ref 4.6–10.2)

## 2017-05-25 MED ORDER — BUPROPION HCL 75 MG PO TABS: 75.0000 mg | ORAL_TABLET | Freq: Two times a day (BID) | ORAL | 1 refills | Status: DC

## 2017-05-25 MED ORDER — HYDROXYZINE HCL 25 MG PO TABS: 12.5000 mg | ORAL_TABLET | Freq: Three times a day (TID) | ORAL | 0 refills | Status: DC | PRN

## 2017-05-25 NOTE — Progress Notes (Signed)
05/25/2017 11:30 AM   DOB: 12-05-1970 / MRN: [id number]  SUBJECTIVE:  Joe Farrell is a 47 y.o. male presenting for right shoulder pain that started months ago.  Tells me the pain is mostly posterior and superior.  Lifting makes the pain worse and he describes as sharp.  Denies weakness about the joint.    Tells me that he has possible osteonecrosis of the hip bilaterally. This was "checked" about ten years ago.   Tells me that he has been depressed now for a few years.  Endorses crying spells, anhedonia, and some feelings that he would be better off dead but no plan. Wife is here with him today and tells me that he did well on Prozac because he was less sad and seemed more involved in life.   He has No Known Allergies.   He  has a past medical history of Anxiety and Depression.    He  reports that he has been smoking cigarettes.  He has been smoking about 1.00 pack per day. he has never used smokeless tobacco. He reports that he does not drink alcohol or use drugs. He  reports that he currently engages in sexual activity. The patient  has a past surgical history that includes Wisdom tooth extraction and Vasectomy.  His family history includes Hyperlipidemia in his mother.  Review of Systems  Constitutional: Negative for chills, diaphoresis, fever and malaise/fatigue.  HENT: Negative for sore throat.   Respiratory: Negative for cough.   Gastrointestinal: Negative for constipation and diarrhea.  Genitourinary: Negative.   Musculoskeletal: Positive for joint pain and myalgias. Negative for back pain, falls and neck pain.  Skin: Negative for itching and rash.  Neurological: Negative for dizziness.  Psychiatric/Behavioral: Positive for depression.    The problem list and medications were reviewed and updated by myself where necessary and exist elsewhere in the encounter.   OBJECTIVE:  BP 132/88   Pulse 88   Temp 98.7 F (37.1 C)   Resp 16   Ht 6' 1.5" (1.867 m)   Wt 205 lb  6.4 oz (93.2 kg)   SpO2 100%   BMI 26.73 kg/m   Physical Exam  Constitutional: He is oriented to person, place, and time.  Musculoskeletal: Normal range of motion. He exhibits tenderness.       Right hip: Normal.       Left hip: Normal.       Arms: Neurological: He is alert and oriented to person, place, and time.    No results found for this or any previous visit (from the past 72 hour(s)).  Dg Shoulder Right  Result Date: 05/25/2017 CLINICAL DATA:  Chronic right shoulder pain for several months. EXAM: RIGHT SHOULDER - 2+ VIEW COMPARISON:  None. FINDINGS: There is no evidence of fracture or dislocation. There is no evidence of arthropathy or other focal bone abnormality. Soft tissues are unremarkable. IMPRESSION: Negative. Electronically Signed   By: Earle Gell M.D.   On: 05/25/2017 10:58    ASSESSMENT AND PLAN:  Joe Farrell was seen today for referral.  Diagnoses and all orders for this visit:  Moderate episode of recurrent major depressive disorder ([place]) -     buPROPion (WELLBUTRIN) 75 MG tablet; Take 1 tablet (75 mg total) by mouth 2 (two) times daily. Take one tab first thing the the morning and another tab at lunch. -     hydrOXYzine (ATARAX/VISTARIL) 25 MG tablet; Take 0.5-1 tablets (12.5-25 mg total) by mouth every 8 (eight) hours  as needed for itching (For sleep).  Need for prophylactic vaccination and inoculation against influenza -     Flu Vaccine QUAD 6+ mos PF IM (Fluarix Quad PF)  Need for prophylactic vaccination with combined diphtheria-tetanus-pertussis (DTP) vaccine -     Tdap vaccine greater than or equal to 7yo IM  Avascular necrosis of bone of hip, unspecified laterality (HCC) -     B12 and Folate Panel -     POCT CBC -     Ambulatory referral to Orthopedic Surgery -     Sedimentation Rate -     C-reactive protein -     Hemoglobin A1c -     Basic Metabolic Panel  Chronic right shoulder pain -     DG Shoulder Right; Future    The patient is advised to  call or return to clinic if he does not see an improvement in symptoms, or to seek the care of the closest emergency department if he worsens with the above plan.   [name], MHS, PA-C [facility] 05/25/2017 11:30 AM

## 2017-05-25 NOTE — Patient Instructions (Addendum)
  COme back in one month.  Your shoulder is normal on xray. Okay to take tylenol 1000 mg every 8 hours for shoulder pain for now.   Please start the wellbutrin as prescribed.  Referral to orthopedics is out so you should get a phone call from their office.    IF you received an x-ray today, you will receive an invoice from [facility]. Please contact [facility] at [phone] with questions or concerns regarding your invoice.   IF you received labwork today, you will receive an invoice from [facility]. Please contact [facility] at [phone] with questions or concerns regarding your invoice.   Our billing staff will not be able to assist you with questions regarding bills from these companies.  You will be contacted with the lab results as soon as they are available. The fastest way to get your results is to activate your My Chart account. Instructions are located on the last page of this paperwork. If you have not heard from [place] regarding the results in 2 weeks, please contact this office.

## 2017-05-26 LAB — BASIC METABOLIC PANEL
BUN/Creatinine Ratio: 12 (ref 9–20)
BUN: 12 mg/dL (ref 6–24)
CALCIUM: 10.4 mg/dL — AB (ref 8.7–10.2)
CO2: 25 mmol/L (ref 20–29)
Chloride: 101 mmol/L (ref 96–106)
Creatinine, Ser: 0.97 mg/dL (ref 0.76–1.27)
GFR calc Af Amer: 108 mL/min/{1.73_m2} (ref >59)
GFR calc non Af Amer: 93 mL/min/{1.73_m2} (ref >59)
GLUCOSE: 82 mg/dL (ref 65–99)
Potassium: 4.9 mmol/L (ref 3.5–5.2)
Sodium: 141 mmol/L (ref 134–144)

## 2017-05-26 LAB — B12 AND FOLATE PANEL
Folate: 4.3 ng/mL (ref >3.0)
Vitamin B-12: 702 pg/mL (ref 232–1245)

## 2017-05-26 LAB — HEMOGLOBIN A1C
ESTIMATED AVERAGE GLUCOSE: 111 mg/dL
HEMOGLOBIN A1C: 5.5 % (ref 4.8–5.6)

## 2017-05-26 LAB — SEDIMENTATION RATE: Sed Rate: 3 mm/hr (ref 0–15)

## 2017-05-26 LAB — C-REACTIVE PROTEIN: CRP: 2.4 mg/L (ref 0.0–4.9)

## 2017-06-22 ENCOUNTER — Encounter: Payer: Self-pay | Admitting: Physician Assistant

## 2017-06-25 ENCOUNTER — Encounter: Payer: Self-pay | Admitting: Physician Assistant

## 2017-06-25 ENCOUNTER — Other Ambulatory Visit: Payer: Self-pay

## 2017-06-25 ENCOUNTER — Ambulatory Visit: Payer: 59 | Admitting: Physician Assistant

## 2017-06-25 VITALS — BP 122/80 | HR 88 | Temp 97.7°F | Resp 16 | Ht 73.5 in | Wt 215.6 lb

## 2017-06-25 DIAGNOSIS — F331 Major depressive disorder, recurrent, moderate: Secondary | ICD-10-CM | POA: Diagnosis not present

## 2017-06-25 DIAGNOSIS — Z13220 Encounter for screening for lipoid disorders: Secondary | ICD-10-CM

## 2017-06-25 DIAGNOSIS — F17200 Nicotine dependence, unspecified, uncomplicated: Secondary | ICD-10-CM | POA: Diagnosis not present

## 2017-06-25 LAB — LIPID PANEL
Chol/HDL Ratio: 5.9 ratio — ABNORMAL HIGH (ref 0.0–5.0)
Cholesterol, Total: 219 mg/dL — ABNORMAL HIGH (ref 100–199)
HDL: 37 mg/dL — ABNORMAL LOW (ref >39)
LDL CALC: 155 mg/dL — AB (ref 0–99)
TRIGLYCERIDES: 137 mg/dL (ref 0–149)
VLDL Cholesterol Cal: 27 mg/dL (ref 5–40)

## 2017-06-25 NOTE — Progress Notes (Addendum)
06/25/2017 9:24 AM   DOB: June 05, 1970 / MRN: [id number]  SUBJECTIVE:  Joe Farrell is a 47 y.o. male presenting for follow up of depression. Feels this is helping him feel a little more positive.  Emailed me two days ago and felt the medication was not quite enough.  We moved this up to TID. Denies any SI/HI. Wife thinks he is headed in the right direction as well.  He has no difficulty with sleep.   He has an appointment with orthopedics on the 7th for hip pain.   He has been decreasing his smoking consumption. Feels that the wellbutrin does reduce his cravings. Has reduced his consumption to 1/2 pack.   He was at one time locked up.  He has screened negative for HIV and hepatitis C since his discharge.  Immunization History  Administered Date(s) Administered  . Influenza,inj,Quad PF,6+ Mos 07/27/2015, 03/30/2016, 05/25/2017  . Tdap 05/25/2017     He has No Known Allergies.   He  has a past medical history of Anxiety and Depression.    He  reports that he has been smoking cigarettes.  He has been smoking about 1.00 pack per day. he has never used smokeless tobacco. He reports that he does not drink alcohol or use drugs. He  reports that he currently engages in sexual activity. The patient  has a past surgical history that includes Wisdom tooth extraction and Vasectomy.  His family history includes Hyperlipidemia in his mother.  Review of Systems  Constitutional: Negative for chills, diaphoresis and fever.  Respiratory: Negative for cough, hemoptysis, sputum production, shortness of breath and wheezing.   Cardiovascular: Negative for chest pain, orthopnea and leg swelling.  Gastrointestinal: Negative for nausea.  Musculoskeletal: Negative for myalgias.  Skin: Negative for rash.  Neurological: Negative for dizziness.    The problem list and medications were reviewed and updated by myself where necessary and exist elsewhere in the encounter.   OBJECTIVE:  BP 122/80 (BP  Location: Right Arm, Patient Position: Sitting, Cuff Size: Large)   Pulse 88   Temp 97.7 F (36.5 C) (Oral)   Resp 16   Ht 6' 1.5" (1.867 m)   Wt 215 lb 9.6 oz (97.8 kg)   SpO2 100%   BMI 28.06 kg/m   Wt Readings from Last 3 Encounters:  06/25/17 215 lb 9.6 oz (97.8 kg)  05/25/17 205 lb 6.4 oz (93.2 kg)  03/31/16 196 lb (88.9 kg)   Physical Exam  Constitutional: He appears well-developed. He is active and cooperative.  Non-toxic appearance.  Cardiovascular: Normal rate, regular rhythm, S1 normal, S2 normal, normal heart sounds, intact distal pulses and normal pulses. Exam reveals no gallop and no friction rub.  No murmur heard. Pulmonary/Chest: Effort normal. No stridor. No tachypnea. No respiratory distress. He has no wheezes. He has no rales.  Abdominal: He exhibits no distension.  Musculoskeletal: He exhibits no edema.  Neurological: He is alert.  Skin: Skin is warm and dry. He is not diaphoretic. No pallor.  Vitals reviewed.   No results found for this or any previous visit (from the past 72 hour(s)).  No results found.  ASSESSMENT AND PLAN:  Joe Farrell was seen today for depression.  Diagnoses and all orders for this visit:  Moderate episode of recurrent major depressive disorder ([place]) Comments: Improving.  Will hold his Wellbutrin at 75 mg 3 times daily for the next 2 weeks.  After this he will email me through my chart for a progress update.  Screening cholesterol level -     Lipid panel  Current every day smoker: I have asked that he come back in about 3 months for a progress update on his smoking.  He is made some headway here and is down to 1/2 pack a day.  He is also vaping.  We will focus on instituting a plan to help him stop completely.  We will discuss his ASCVD risk factor analysis at that time as well.    The patient is advised to call or return to clinic if he does not see an improvement in symptoms, or to seek the care of the closest emergency department  if he worsens with the above plan.   [name], MHS, PA-C [facility] 06/25/2017 9:24 AM

## 2017-06-25 NOTE — Patient Instructions (Addendum)
Email me in 2 weeks about the progress with three times daily wellbutrin.   Come back in about 3 months for a smoking cessation plan.      IF you received an x-ray today, you will receive an invoice from [facility]. Please contact [facility] at [phone] with questions or concerns regarding your invoice.   IF you received labwork today, you will receive an invoice from [facility]. Please contact [facility] at [phone] with questions or concerns regarding your invoice.   Our billing staff will not be able to assist you with questions regarding bills from these companies.  You will be contacted with the lab results as soon as they are available. The fastest way to get your results is to activate your My Chart account. Instructions are located on the last page of this paperwork. If you have not heard from [place] regarding the results in 2 weeks, please contact this office.

## 2017-06-26 ENCOUNTER — Encounter: Payer: Self-pay | Admitting: Physician Assistant

## 2017-07-09 ENCOUNTER — Encounter: Payer: Self-pay | Admitting: Physician Assistant

## 2017-07-09 DIAGNOSIS — F331 Major depressive disorder, recurrent, moderate: Secondary | ICD-10-CM

## 2017-07-09 NOTE — Telephone Encounter (Signed)
Caller name: Erby Sanderson Relationship to patient: wife Can be reached: [phone] (pt) Pharmacy:  [facility] [address] ([address] [place] [phone] (Phone) [phone] (Fax)   Reason for call: pt wife called in stating that pt dropped bottle of Wellbutrin in the sink this morning and has no medication left. The few that didn't go down the sink are broken. Pt reqeusting that we send in new RX based on mychart below that he was to increase to 3x per day.

## 2017-07-10 ENCOUNTER — Telehealth: Payer: Self-pay | Admitting: Physician Assistant

## 2017-07-10 ENCOUNTER — Encounter: Payer: Self-pay | Admitting: Physician Assistant

## 2017-07-10 MED ORDER — BUPROPION HCL 75 MG PO TABS: 75.0000 mg | ORAL_TABLET | Freq: Three times a day (TID) | ORAL | 3 refills | Status: DC

## 2017-07-10 NOTE — Telephone Encounter (Signed)
Clarification on dosage and how to take it, needed on this prescription of Welbutrin please. Please review.

## 2017-07-10 NOTE — Telephone Encounter (Signed)
Copied from [place] [id number]. Topic: Quick Communication - Rx Refill/Question >> Jul 10, 2017  1:41 PM Robina Ade, Helene Kelp D wrote: Medication: buPROPion [facility]) 75 MG tablet   Has the patient contacted their pharmacy?Yes, need clarification on how patient needs to take. They need to know if its 2x a day or 3 times a day with correct instruction and doze.   (Agent: If no, request that the patient contact the pharmacy for the refill.)   Preferred Pharmacy (with phone number or street name): [facility] ([facility] [place] - [address]   Agent: Please be advised that RX refills may take up to 3 business days. We ask that you follow-up with your pharmacy.

## 2017-07-11 NOTE — Telephone Encounter (Signed)
Per [name], Wellbutrin 61m is to be taken 3 times daily. Phone call to pharmacy, spoke with [name] Relayed message from provider. She is agreeable, has no further questions.

## 2017-08-16 ENCOUNTER — Encounter: Payer: Self-pay | Admitting: Physician Assistant

## 2017-09-26 ENCOUNTER — Ambulatory Visit: Payer: 59 | Admitting: Physician Assistant

## 2017-09-26 ENCOUNTER — Other Ambulatory Visit: Payer: Self-pay

## 2017-09-26 ENCOUNTER — Encounter: Payer: Self-pay | Admitting: Physician Assistant

## 2017-09-26 VITALS — BP 142/92 | HR 75 | Temp 97.6°F | Ht 73.5 in | Wt 221.6 lb

## 2017-09-26 DIAGNOSIS — Z87891 Personal history of nicotine dependence: Secondary | ICD-10-CM

## 2017-09-26 DIAGNOSIS — F331 Major depressive disorder, recurrent, moderate: Secondary | ICD-10-CM | POA: Diagnosis not present

## 2017-09-26 NOTE — Progress Notes (Signed)
09/26/2017 8:48 AM   DOB: 08/31/1970 / MRN: [id number]  SUBJECTIVE:  Joe Farrell is a 47 y.o. male presenting for recheck of anxiety and depression.  He was able to quit smoking roughly 2 months ago.  I shared his ASCVD risk with him and advised that this will be roughly half an about 4 months.  I have encouraged him to continue smoking cessation.  He tells me he is having no problems at this time.  He would like to discuss this anxiety and depression today.  He reports feeling less sad however continues to have daily dysthymic episodes.  Denies tearfulness.  Tells me he just got a raise at work and occupational he is doing extremely well.  Tells me his relationship with his wife is also doing well.  Depression screen PHQ 2/9 09/26/2017  Decreased Interest 0  Down, Depressed, Hopeless 0  PHQ - 2 Score 0  Altered sleeping -  Tired, decreased energy -  Change in appetite -  Feeling bad or failure about yourself  -  Trouble concentrating -  Moving slowly or fidgety/restless -  Suicidal thoughts -  PHQ-9 Score -  Difficult doing work/chores -     He has No Known Allergies.   He  has a past medical history of Anxiety and Depression.    He  reports that he quit smoking about 1 months ago. His smoking use included cigarettes. He smoked 1.00 pack per day. He has never used smokeless tobacco. He reports that he does not drink alcohol or use drugs. He  reports that he currently engages in sexual activity. The patient  has a past surgical history that includes Wisdom tooth extraction and Vasectomy.  His family history includes Hyperlipidemia in his mother.  Review of Systems  Constitutional: Negative for chills, diaphoresis and fever.  Eyes: Negative.   Respiratory: Negative for cough, hemoptysis, sputum production, shortness of breath and wheezing.   Cardiovascular: Negative for chest pain, orthopnea and leg swelling.  Gastrointestinal: Negative for abdominal pain, blood in stool,  constipation, diarrhea, heartburn, melena, nausea and vomiting.  Genitourinary: Negative for dysuria, flank pain, frequency, hematuria and urgency.  Skin: Negative for rash.  Neurological: Negative for dizziness, sensory change, speech change, focal weakness and headaches.    The problem list and medications were reviewed and updated by myself where necessary and exist elsewhere in the encounter.   OBJECTIVE:  BP (!) 142/92 (BP Location: Left Arm, Patient Position: Sitting, Cuff Size: Normal)   Pulse 75   Temp 97.6 F (36.4 C) (Oral)   Ht 6' 1.5" (1.867 m)   Wt 221 lb 9.6 oz (100.5 kg)   SpO2 100%   BMI 28.84 kg/m   Wt Readings from Last 3 Encounters:  09/26/17 221 lb 9.6 oz (100.5 kg)  06/25/17 215 lb 9.6 oz (97.8 kg)  05/25/17 205 lb 6.4 oz (93.2 kg)   Temp Readings from Last 3 Encounters:  09/26/17 97.6 F (36.4 C) (Oral)  06/25/17 97.7 F (36.5 C) (Oral)  05/25/17 98.7 F (37.1 C)   BP Readings from Last 3 Encounters:  09/26/17 (!) 142/92  06/25/17 122/80  05/25/17 132/88   Pulse Readings from Last 3 Encounters:  09/26/17 75  06/25/17 88  05/25/17 88     Lab Results  Component Value Date   WBC 12.7 (A) 05/25/2017   HGB 17.3 05/25/2017   HCT 50.4 05/25/2017   MCV 92.7 05/25/2017   PLT 262 10/19/2015    Lab  Results  Component Value Date   CREATININE 0.97 05/25/2017   BUN 12 05/25/2017   NA 141 05/25/2017   K 4.9 05/25/2017   CL 101 05/25/2017   CO2 25 05/25/2017    Lab Results  Component Value Date   ALT 27 10/19/2015   AST 23 10/19/2015   ALKPHOS 95 10/19/2015   BILITOT 0.4 10/19/2015    Lab Results  Component Value Date   TSH 1.79 10/19/2015    Lab Results  Component Value Date   HGBA1C 5.5 05/25/2017    Lab Results  Component Value Date   CHOL 219 (H) 06/25/2017   HDL 37 (L) 06/25/2017   LDLCALC 155 (H) 06/25/2017   TRIG 137 06/25/2017   CHOLHDL 5.9 (H) 06/25/2017    Physical Exam  Constitutional: He is oriented to  person, place, and time. He appears well-developed. He does not appear ill.  Eyes: Pupils are equal, round, and reactive to light. Conjunctivae and EOM are normal.  Cardiovascular: Normal rate, regular rhythm, S1 normal, S2 normal, normal heart sounds, intact distal pulses and normal pulses. Exam reveals no gallop and no friction rub.  No murmur heard. Pulmonary/Chest: Effort normal. He has no rales.  Abdominal: He exhibits no distension.  Musculoskeletal: Normal range of motion. He exhibits no edema.  Neurological: He is alert and oriented to person, place, and time. No cranial nerve deficit. Coordination normal.  Skin: Skin is warm and dry. No rash noted. He is not diaphoretic. No erythema. No pallor.  Psychiatric: He has a normal mood and affect. His behavior is normal. Judgment and thought content normal.  Nursing note and vitals reviewed.   No results found for this or any previous visit (from the past 72 hour(s)).  No results found.  ASSESSMENT AND PLAN:  [name] was seen today for follow-up.  Diagnoses and all orders for this visit:  Moderate episode of recurrent major depressive disorder ([place]): From a function standpoint he is doing exquisitely well.  Relationships are good and occupationally he is doing well.  Continues to complain of sadness.  Of advised he discuss his symptoms with his wife and I would be happy to add an SSRI if needed.  Patient will send me a my chart message.  I will maintain his Wellbutrin at its current frequency and dose.  Former cigarette smoker: I applauded him for smoking cessation.  His ASCVD risk will be cut in half in the next 4 months.  He has a mild dyslipidemia however smoking cessation will mitigate this risk.  I have asked that he purchase a scale and weigh himself daily as his weight is up 15 pounds or so since we have met.  This is secondary to smoking cessation surely.  I would like to see him back in about 3 months for a recheck of weight and  depression.    The patient is advised to call or return to clinic if he does not see an improvement in symptoms, or to seek the care of the closest emergency department if he worsens with the above plan.   [name], MHS, PA-C [facility] 09/26/2017 8:48 AM

## 2017-10-01 ENCOUNTER — Other Ambulatory Visit: Payer: Self-pay | Admitting: Family Medicine

## 2017-10-01 ENCOUNTER — Encounter: Payer: Self-pay | Admitting: Physician Assistant

## 2017-10-01 DIAGNOSIS — F331 Major depressive disorder, recurrent, moderate: Secondary | ICD-10-CM

## 2017-10-02 ENCOUNTER — Encounter: Payer: Self-pay | Admitting: Physician Assistant

## 2017-10-02 MED ORDER — FLUOXETINE HCL 20 MG PO TABS: 20.0000 mg | ORAL_TABLET | Freq: Every day | ORAL | 3 refills | Status: DC

## 2017-10-02 NOTE — Telephone Encounter (Signed)
Atarax refill Last OV:05/25/17 Last refill:05/25/17 60 tab/0 refill AYT:[id number] Pharmacy: [facility] [address] ([address] [place] [phone] (Phone) [phone] (Fax)

## 2017-11-27 ENCOUNTER — Other Ambulatory Visit: Payer: Self-pay | Admitting: Physician Assistant

## 2017-11-27 DIAGNOSIS — F331 Major depressive disorder, recurrent, moderate: Secondary | ICD-10-CM

## 2018-03-18 ENCOUNTER — Other Ambulatory Visit: Payer: Self-pay | Admitting: Physician Assistant

## 2018-03-18 NOTE — Telephone Encounter (Signed)
Left a VM in regards to scheduling him an appt to get a medication refill.

## 2018-03-19 ENCOUNTER — Encounter: Payer: Self-pay | Admitting: Physician Assistant

## 2018-03-19 NOTE — Telephone Encounter (Signed)
Message for appt sent to scheduling

## 2018-03-30 ENCOUNTER — Encounter: Payer: Self-pay | Admitting: Family Medicine

## 2018-03-30 ENCOUNTER — Other Ambulatory Visit: Payer: Self-pay

## 2018-03-30 ENCOUNTER — Ambulatory Visit (INDEPENDENT_AMBULATORY_CARE_PROVIDER_SITE_OTHER): Payer: 59 | Admitting: Family Medicine

## 2018-03-30 VITALS — BP 140/76 | HR 64 | Temp 99.0°F | Resp 16 | Ht 74.0 in | Wt 199.0 lb

## 2018-03-30 DIAGNOSIS — F331 Major depressive disorder, recurrent, moderate: Secondary | ICD-10-CM

## 2018-03-30 DIAGNOSIS — F411 Generalized anxiety disorder: Secondary | ICD-10-CM

## 2018-03-30 DIAGNOSIS — Z760 Encounter for issue of repeat prescription: Secondary | ICD-10-CM

## 2018-03-30 DIAGNOSIS — Z23 Encounter for immunization: Secondary | ICD-10-CM

## 2018-03-30 DIAGNOSIS — F1911 Other psychoactive substance abuse, in remission: Secondary | ICD-10-CM | POA: Diagnosis not present

## 2018-03-30 MED ORDER — FLUOXETINE HCL 20 MG PO CAPS: 20.0000 mg | ORAL_CAPSULE | Freq: Every day | ORAL | 0 refills | Status: DC

## 2018-03-30 MED ORDER — BUPROPION HCL ER (XL) 300 MG PO TB24: 300.0000 mg | ORAL_TABLET | Freq: Every day | ORAL | 0 refills | Status: DC

## 2018-03-30 MED ORDER — HYDROXYZINE HCL 25 MG PO TABS: ORAL_TABLET | ORAL | 0 refills | Status: DC

## 2018-03-30 NOTE — Patient Instructions (Addendum)
If you have lab work done today you will be contacted with your lab results within the next 2 weeks.  If you have not heard from [place] then please contact us. The fastest way to get your results is to register for My Chart.   IF you received an x-ray today, you will receive an invoice from [facility]. Please contact [facility] at [phone] with questions or concerns regarding your invoice.   IF you received labwork today, you will receive an invoice from [facility]. Please contact [facility] at [phone] with questions or concerns regarding your invoice.   Our billing staff will not be able to assist you with questions regarding bills from these companies.  You will be contacted with the lab results as soon as they are available. The fastest way to get your results is to activate your My Chart account. Instructions are located on the last page of this paperwork. If you have not heard from [place] regarding the results in 2 weeks, please contact this office.        Living With Depression Everyone experiences occasional disappointment, sadness, and loss in their lives. When you are feeling down, blue, or sad for at least 2 weeks in a row, it may mean that you have depression. Depression can affect your thoughts and feelings, relationships, daily activities, and physical health. It is caused by changes in the way your brain functions. If you receive a diagnosis of depression, your health care provider will tell you which type of depression you have and what treatment options are available to you. If you are living with depression, there are ways to help you recover from it and also ways to prevent it from coming back. How to cope with lifestyle changes Coping with stress Stress is your body's reaction to life changes and events, both good and bad. Stressful situations may include:  Getting married.  The death of a spouse.  Losing a job.  Retiring.  Having a  baby.  Stress can last just a few hours or it can be ongoing. Stress can play a major role in depression, so it is important to learn both how to cope with stress and how to think about it differently. Talk with your health care provider or a counselor if you would like to learn more about stress reduction. He or she may suggest some stress reduction techniques, such as:  Music therapy. This can include creating music or listening to music. Choose music that you enjoy and that inspires you.  Mindfulness-based meditation. This kind of meditation can be done while sitting or walking. It involves being aware of your normal breaths, rather than trying to control your breathing.  Centering prayer. This is a kind of meditation that involves focusing on a spiritual word or phrase. Choose a word, phrase, or sacred image that is meaningful to you and that brings you peace.  Deep breathing. To do this, expand your stomach and inhale slowly through your nose. Hold your breath for 3-5 seconds, then exhale slowly, allowing your stomach muscles to relax.  Muscle relaxation. This involves intentionally tensing muscles then relaxing them.  Choose a stress reduction technique that fits your lifestyle and personality. Stress reduction techniques take time and practice to develop. Set aside 5-15 minutes a day to do them. Therapists can offer training in these techniques. The training may be covered by some insurance plans. Other things you can do to manage stress include:  Keeping a stress diary.  This can help you learn what triggers your stress and ways to control your response.  Understanding what your limits are and saying no to requests or events that lead to a schedule that is too full.  Thinking about how you respond to certain situations. You may not be able to control everything, but you can control how you react.  Adding humor to your life by watching funny films or TV shows.  Making time for  activities that help you relax and not feeling guilty about spending your time this way.  Medicines Your health care provider may suggest certain medicines if he or she feels that they will help improve your condition. Avoid using alcohol and other substances that may prevent your medicines from working properly (may interact). It is also important to:  Talk with your pharmacist or health care provider about all the medicines that you take, their possible side effects, and what medicines are safe to take together.  Make it your goal to take part in all treatment decisions (shared decision-making). This includes giving input on the side effects of medicines. It is best if shared decision-making with your health care provider is part of your total treatment plan.  If your health care provider prescribes a medicine, you may not notice the full benefits of it for 4-8 weeks. Most people who are treated for depression need to be on medicine for at least 6-12 months after they feel better. If you are taking medicines as part of your treatment, do not stop taking medicines without first talking to your health care provider. You may need to have the medicine slowly decreased (tapered) over time to decrease the risk of harmful side effects. Relationships Your health care provider may suggest family therapy along with individual therapy and drug therapy. While there may not be family problems that are causing you to feel depressed, it is still important to make sure your family learns as much as they can about your mental health. Having your family's support can help make your treatment successful. How to recognize changes in your condition Everyone has a different response to treatment for depression. Recovery from major depression happens when you have not had signs of major depression for two months. This may mean that you will start to:  Have more interest in doing activities.  Feel less hopeless than you  did 2 months ago.  Have more energy.  Overeat less often, or have better or improving appetite.  Have better concentration.  Your health care provider will work with you to decide the next steps in your recovery. It is also important to recognize when your condition is getting worse. Watch for these signs:  Having fatigue or low energy.  Eating too much or too little.  Sleeping too much or too little.  Feeling restless, agitated, or hopeless.  Having trouble concentrating or making decisions.  Having unexplained physical complaints.  Feeling irritable, angry, or aggressive.  Get help as soon as you or your family members notice these symptoms coming back. How to get support and help from others How to talk with friends and family members about your condition Talking to friends and family members about your condition can provide you with one way to get support and guidance. Reach out to trusted friends or family members, explain your symptoms to them, and let them know that you are working with a health care provider to treat your depression. Financial resources Not all insurance plans cover mental health care,  so it is important to check with your insurance carrier. If paying for co-pays or counseling services is a problem, search for a local or county mental health care center. They may be able to offer public mental health care services at low or no cost when you are not able to see a private health care provider. If you are taking medicine for depression, you may be able to get the generic form, which may be less expensive. Some makers of prescription medicines also offer help to patients who cannot afford the medicines they need. Follow these instructions at home:  Get the right amount and quality of sleep.  Cut down on using caffeine, tobacco, alcohol, and other potentially harmful substances.  Try to exercise, such as walking or lifting small weights.  Take  over-the-counter and prescription medicines only as told by your health care provider.  Eat a healthy diet that includes plenty of vegetables, fruits, whole grains, low-fat dairy products, and lean protein. Do not eat a lot of foods that are high in solid fats, added sugars, or salt.  Keep all follow-up visits as told by your health care provider. This is important. Contact a health care provider if:  You stop taking your antidepressant medicines, and you have any of these symptoms: ? Nausea. ? Headache. ? Feeling lightheaded. ? Chills and body aches. ? Not being able to sleep (insomnia).  You or your friends and family think your depression is getting worse. Get help right away if:  You have thoughts of hurting yourself or others. If you ever feel like you may hurt yourself or others, or have thoughts about taking your own life, get help right away. You can go to your nearest emergency department or call:  Your local emergency services (911 in the [place]  A suicide crisis helpline, such as the [facility] at (712)457-0917. This is open 24-hours a day.  Summary  If you are living with depression, there are ways to help you recover from it and also ways to prevent it from coming back.  Work with your health care team to create a management plan that includes counseling, stress management techniques, and healthy lifestyle habits. This information is not intended to replace advice given to you by your health care provider. Make sure you discuss any questions you have with your health care provider. Document Released: 04/10/2016 Document Revised: 04/10/2016 Document Reviewed: 04/10/2016 Elsevier Interactive Patient Education  [employer].

## 2018-03-30 NOTE — Progress Notes (Signed)
Patient ID: Joe Farrell, male    DOB: 04-16-1971, 47 y.o.   MRN: [id number]  PCP: Leonard Downing, MD  Chief Complaint  Patient presents with  . Follow-up    from Prozac refill  . Medication Refill    Hydroxyzine HCl 25 mg tab, Duexis     Subjective:  HPI Joe Farrell is a 47 y.o. male presents for a medication refill.Patient was a previous patient of [name]. He is currently out of his chronic medication for anxiety and depression and requests a refill. Reports good night rest. Actively employed. Denies any new life stressors. Medication is effective at current doses. Wishes to decrease the frequency of Wellbutrin by changing to an extended release. He denies suicidal ideations, auditory hallucinations, or homicidal ideations. BP elevated today, however recently drank a Red Bull. Social History   Socioeconomic History  . Marital status: Married    Spouse name: Not on file  . Number of children: Not on file  . Years of education: Not on file  . Highest education level: Not on file  Occupational History  . Not on file  Social Needs  . Financial resource strain: Not on file  . Food insecurity:    Worry: Not on file    Inability: Not on file  . Transportation needs:    Medical: Not on file    Non-medical: Not on file  Tobacco Use  . Smoking status: Former Smoker    Packs/day: 1.00    Types: Cigarettes    Last attempt to quit: 07/28/2017    Years since quitting: 0.6  . Smokeless tobacco: Never Used  Substance and Sexual Activity  . Alcohol use: No    Alcohol/week: 0.0 standard drinks    Comment: occasionally  . Drug use: No    Types: Methamphetamines  . Sexual activity: Yes  Lifestyle  . Physical activity:    Days per week: Not on file    Minutes per session: Not on file  . Stress: Not on file  Relationships  . Social connections:    Talks on phone: Not on file    Gets together: Not on file    Attends religious service: Not on file    Active member  of club or organization: Not on file    Attends meetings of clubs or organizations: Not on file    Relationship status: Not on file  . Intimate partner violence:    Fear of current or ex partner: Not on file    Emotionally abused: Not on file    Physically abused: Not on file    Forced sexual activity: Not on file  Other Topics Concern  . Not on file  Social History Narrative  . Not on file    Family History  Problem Relation Age of Onset  . Hyperlipidemia Mother    Review of Systems Pertinent negatives listed in HPI Patient Active Problem List   Diagnosis Date Noted  . Current every day smoker 06/25/2017  . 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 Depression 07/29/2015    No Known Allergies  Prior to Admission medications   Medication Sig Start Date End Date Taking? Authorizing Provider  buPROPion (WELLBUTRIN) 75 MG tablet TAKE 1 TABLET BY MOUTH THREE TIMES DAILY 11/28/17   Tereasa Coop, PA-C  FLUoxetine (PROZAC) 20 MG capsule TAKE 1 CAPSULE BY MOUTH ONCE DAILY 03/18/18   Rutherford Guys, MD  hydrOXYzine (ATARAX/VISTARIL) 25 MG tablet TAKE ONE-HALF TO ONE  TABLET BY MOUTH EVERY 8 HOURS AS NEEDED FOR ITCHING (SLEEP) 10/04/17   Tereasa Coop, PA-C  Ibuprofen-Famotidine (DUEXIS) 800-26.6 MG TABS Duexis 800 mg-26.6 mg tablet  1 PO BID-TID PRN    [provider]    Past Medical, Surgical Family and Social History reviewed and updated.    Objective:  There were no vitals filed for this visit.  Wt Readings from Last 3 Encounters:  09/26/17 221 lb 9.6 oz (100.5 kg)  06/25/17 215 lb 9.6 oz (97.8 kg)  05/25/17 205 lb 6.4 oz (93.2 kg)   Physical Exam General appearance: alert, well developed, well nourished, cooperative and in no distress Head: Normocephalic, without obvious abnormality, atraumatic Respiratory: Respirations even and unlabored, normal respiratory rate Heart: rate and rhythm normal. No gallop or murmurs noted on exam  Extremities: No  gross deformities Skin: Skin color, texture, turgor normal. No rashes seen  Psych: Appropriate mood and affect. Neurologic: Mental status: Alert, oriented to person, place, and time, thought content appropriate.  No results found for: POCGLU  Lab Results  Component Value Date   HGBA1C 5.5 05/25/2017      Assessment & Plan:  1. Medication refill -Refilled medication with the exception of Duexis which is managed by orthopedics. -Patient will schedule an appointment to establish with a PCP. 2. Substance abuse in remission ([name]), continued. 3. Moderate episode of recurrent major depressive disorder ([name]), controlled. -Continue Bupropion and Prozac. 4. Generalized Anxiety disorder, controlled.   Meds ordered this encounter  Medications  . FLUoxetine (PROZAC) 20 MG capsule    Sig: Take 1 capsule (20 mg total) by mouth daily.    Dispense:  90 capsule    Refill:  0    Please consider 90 day supplies to promote better adherence  . hydrOXYzine (ATARAX/VISTARIL) 25 MG tablet    Sig: TAKE ONE-HALF TO ONE TABLET BY MOUTH EVERY 8 HOURS AS NEEDED FOR ITCHING (SLEEP)    Dispense:  90 tablet    Refill:  0    Please consider 90 day supplies to promote better adherence  . buPROPion (WELLBUTRIN XL) 300 MG 24 hr tablet    Sig: Take 1 tablet (300 mg total) by mouth daily.    Dispense:  90 tablet    Refill:  0   -The patient was given clear instructions to go to ER or return to medical center if symptoms do not improve, worsen or new problems develop. The patient verbalized understanding.     Carroll Sage. Kenton Kingfisher, FNP-C Nurse Practitioner (PRN Staff)  [facility] [address] [place], [place]

## 2018-07-29 ENCOUNTER — Telehealth: Payer: Self-pay | Admitting: Family Medicine

## 2018-08-07 ENCOUNTER — Other Ambulatory Visit: Payer: Self-pay

## 2018-08-07 MED ORDER — FLUOXETINE HCL 20 MG PO CAPS: 20.0000 mg | ORAL_CAPSULE | Freq: Every day | ORAL | 1 refills | Status: DC

## 2018-08-07 NOTE — Telephone Encounter (Signed)
RX sent in

## 2018-08-07 NOTE — Telephone Encounter (Signed)
Pt wife called and scheduled transfer of care to Dr. Mitchel Honour for first available 09/25/2018. Pt is leaving town tonight and needing refill on prozac. Pt takes 1/night and has 1 left but again going out of town. Please send enough to cover until appt.    [place] ([facility]), [place]

## 2018-09-25 ENCOUNTER — Other Ambulatory Visit: Payer: Self-pay

## 2018-09-25 ENCOUNTER — Encounter: Payer: Self-pay | Admitting: Emergency Medicine

## 2018-09-25 ENCOUNTER — Telehealth (INDEPENDENT_AMBULATORY_CARE_PROVIDER_SITE_OTHER): Payer: 59 | Admitting: Emergency Medicine

## 2018-09-25 DIAGNOSIS — F331 Major depressive disorder, recurrent, moderate: Secondary | ICD-10-CM

## 2018-09-25 DIAGNOSIS — F329 Major depressive disorder, single episode, unspecified: Secondary | ICD-10-CM

## 2018-09-25 DIAGNOSIS — F32A Depression, unspecified: Secondary | ICD-10-CM

## 2018-09-25 MED ORDER — HYDROXYZINE HCL 25 MG PO TABS: ORAL_TABLET | ORAL | 1 refills | Status: AC

## 2018-09-25 MED ORDER — BUPROPION HCL ER (XL) 300 MG PO TB24: 300.0000 mg | ORAL_TABLET | Freq: Every day | ORAL | 3 refills | Status: DC

## 2018-09-25 MED ORDER — IBUPROFEN-FAMOTIDINE 800-26.6 MG PO TABS: ORAL_TABLET | ORAL | 2 refills | Status: AC

## 2018-09-25 NOTE — Progress Notes (Signed)
Telemedicine Encounter- SOAP NOTE Established Patient  This telephone encounter was conducted with the patient's (or proxy's) verbal consent via audio telecommunications: yes/no: Yes Patient was instructed to have this encounter in a suitably private space; and to only have persons present to whom they give permission to participate. In addition, patient identity was confirmed by use of name plus two identifiers (DOB and address).  I discussed the limitations, risks, security and privacy concerns of performing an evaluation and management service by telephone and the availability of in person appointments. I also discussed with the patient that there may be a patient responsible charge related to this service. The patient expressed understanding and agreed to proceed.  I spent a total of TIME; 0 MIN TO 60 MIN: 10 minutes talking with the patient or their proxy.  No chief complaint on file. Medication refill  Subjective   Joe Farrell is a 48 y.o. male established patient.  First visit with me.  Telephone visit today for medication refills. Has a history of depression taken Wellbutrin and hydroxyzine. Also taking Duexis as needed. No other complaints or medical concerns today.  HPI   Patient Active Problem List   Diagnosis Date Noted  . Current every day smoker 06/25/2017  . 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 Depression 07/29/2015    Past Medical History:  Diagnosis Date  . Anxiety   . Depression     Current Outpatient Medications  Medication Sig Dispense Refill  . buPROPion (WELLBUTRIN XL) 300 MG 24 hr tablet Take 1 tablet (300 mg total) by mouth daily. 90 tablet 0  . FLUoxetine (PROZAC) 20 MG capsule Take 1 capsule (20 mg total) by mouth daily. 30 capsule 1  . hydrOXYzine (ATARAX/VISTARIL) 25 MG tablet TAKE ONE-HALF TO ONE TABLET BY MOUTH EVERY 8 HOURS AS NEEDED FOR ITCHING (SLEEP) 90 tablet 0  . Ibuprofen-Famotidine (DUEXIS) 800-26.6 MG TABS Duexis 800  mg-26.6 mg tablet  1 PO BID-TID PRN     No current facility-administered medications for this visit.     No Known Allergies  Social History   Socioeconomic History  . Marital status: Married    Spouse name: Not on file  . Number of children: Not on file  . Years of education: Not on file  . Highest education level: Not on file  Occupational History  . Not on file  Social Needs  . Financial resource strain: Not on file  . Food insecurity:    Worry: Not on file    Inability: Not on file  . Transportation needs:    Medical: Not on file    Non-medical: Not on file  Tobacco Use  . Smoking status: Former Smoker    Packs/day: 1.00    Types: Cigarettes    Last attempt to quit: 07/28/2017    Years since quitting: 1.1  . Smokeless tobacco: Never Used  Substance and Sexual Activity  . Alcohol use: No    Alcohol/week: 0.0 standard drinks    Comment: occasionally  . Drug use: No    Types: Methamphetamines  . Sexual activity: Yes  Lifestyle  . Physical activity:    Days per week: Not on file    Minutes per session: Not on file  . Stress: Not on file  Relationships  . Social connections:    Talks on phone: Not on file    Gets together: Not on file    Attends religious service: Not on file    Active member  of club or organization: Not on file    Attends meetings of clubs or organizations: Not on file    Relationship status: Not on file  . Intimate partner violence:    Fear of current or ex partner: Not on file    Emotionally abused: Not on file    Physically abused: Not on file    Forced sexual activity: Not on file  Other Topics Concern  . Not on file  Social History Narrative  . Not on file    Review of Systems  Constitutional: Negative.  Negative for chills and fever.  HENT: Negative for congestion and sore throat.   Respiratory: Negative.  Negative for cough and shortness of breath.   Cardiovascular: Negative.  Negative for chest pain and leg swelling.   Gastrointestinal: Negative for abdominal pain, diarrhea, nausea and vomiting.  Skin: Negative for rash.  Neurological: Negative for dizziness and headaches.  All other systems reviewed and are negative.   Objective   Vitals as reported by the patient: None available Awake and oriented x3 in no apparent respiratory distress. There were no vitals filed for this visit.  There are no diagnoses linked to this encounter. Diagnoses and all orders for this visit:  Moderate episode of recurrent major depressive disorder (HCC) -     hydrOXYzine (ATARAX/VISTARIL) 25 MG tablet; TAKE ONE-HALF TO ONE TABLET BY MOUTH EVERY 8 HOURS AS NEEDED FOR ITCHING (SLEEP)  Chronic depression -     Ambulatory referral to Psychiatry  Other orders -     buPROPion (WELLBUTRIN XL) 300 MG 24 hr tablet; Take 1 tablet (300 mg total) by mouth daily. -     Ibuprofen-Famotidine (DUEXIS) 800-26.6 MG TABS; Duexis 800 mg-26.6 mg tablet  1 PO BID-TID PRN     I discussed the assessment and treatment plan with the patient. The patient was provided an opportunity to ask questions and all were answered. The patient agreed with the plan and demonstrated an understanding of the instructions.   The patient was advised to call back or seek an in-person evaluation if the symptoms worsen or if the condition fails to improve as anticipated.  I provided 10 minutes of non-face-to-face time during this encounter.  Horald Pollen, MD  Primary Care at [facility]

## 2018-09-25 NOTE — Progress Notes (Signed)
Contacted patient to triage for appointment. Patient wants to establish care and medication refill on Wellbutrin XL.

## 2018-09-26 ENCOUNTER — Telehealth: Payer: Self-pay | Admitting: Emergency Medicine

## 2018-09-26 NOTE — Telephone Encounter (Signed)
Copied from [place] [id number]. Topic: General - Other >> Sep 25, 2018  4:47 PM Mcneil, Ja-Kwan wrote: Reason for CRM: Pt called back to report that he takes the FLUoxetine (PROZAC) 20 MG capsule but he does not take the buPROPion (WELLBUTRIN XL) 300 MG 24 hr tablet.

## 2018-10-02 ENCOUNTER — Other Ambulatory Visit: Payer: Self-pay | Admitting: Emergency Medicine

## 2018-10-02 NOTE — Telephone Encounter (Signed)
Please advise  Patient is requesting a refill of the following medications: Requested Prescriptions   Pending Prescriptions Disp Refills  . FLUoxetine (PROZAC) 20 MG capsule [Pharmacy Med Name: FLUoxetine HCl 20 MG Oral Capsule] 30 capsule 0    Sig: Take 1 capsule by mouth once daily

## 2018-10-02 NOTE — Telephone Encounter (Signed)
Patient needs an appointment. Will you give a courtesy medication refill?

## 2018-10-10 NOTE — Telephone Encounter (Signed)
Noted. Pt wanted to make sure that he has a refill for the prozac and the medication has been sent on 10/02/2018

## 2018-11-02 IMAGING — DX DG SHOULDER 2+V*R*
3 series · 3 of 3 positions shown · non-contrast
Comparison: None.

CLINICAL DATA: Chronic right shoulder pain for several months.

EXAM:
RIGHT SHOULDER - 2+ VIEW

[shoulder ap]
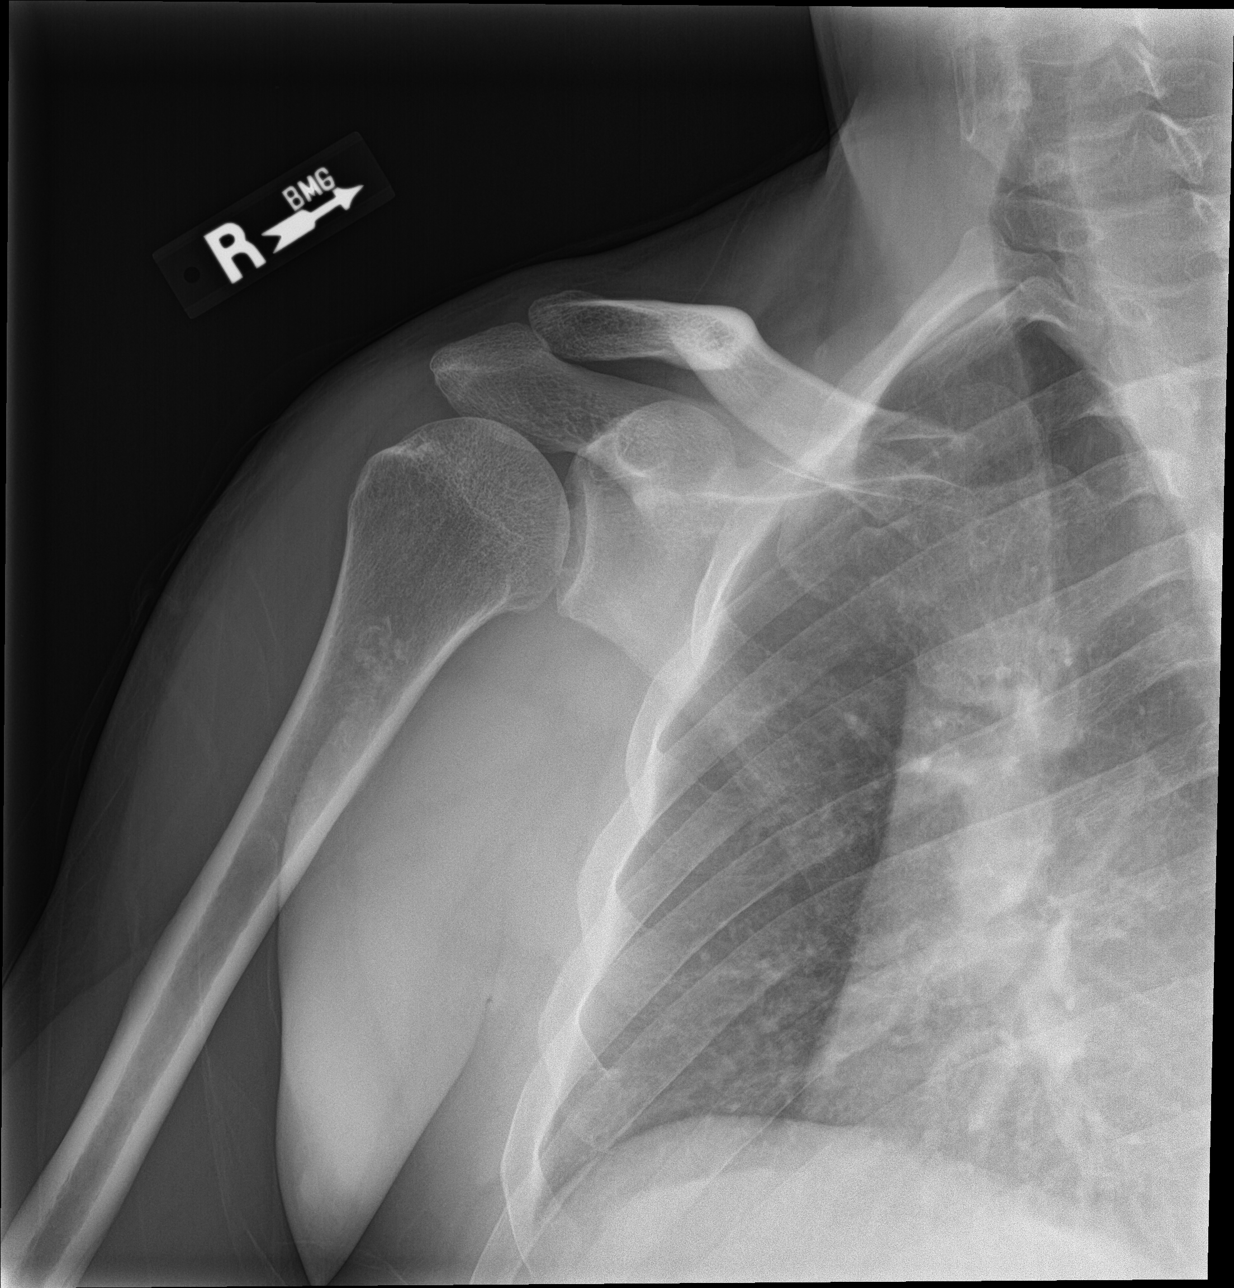

[shoulder y-view]
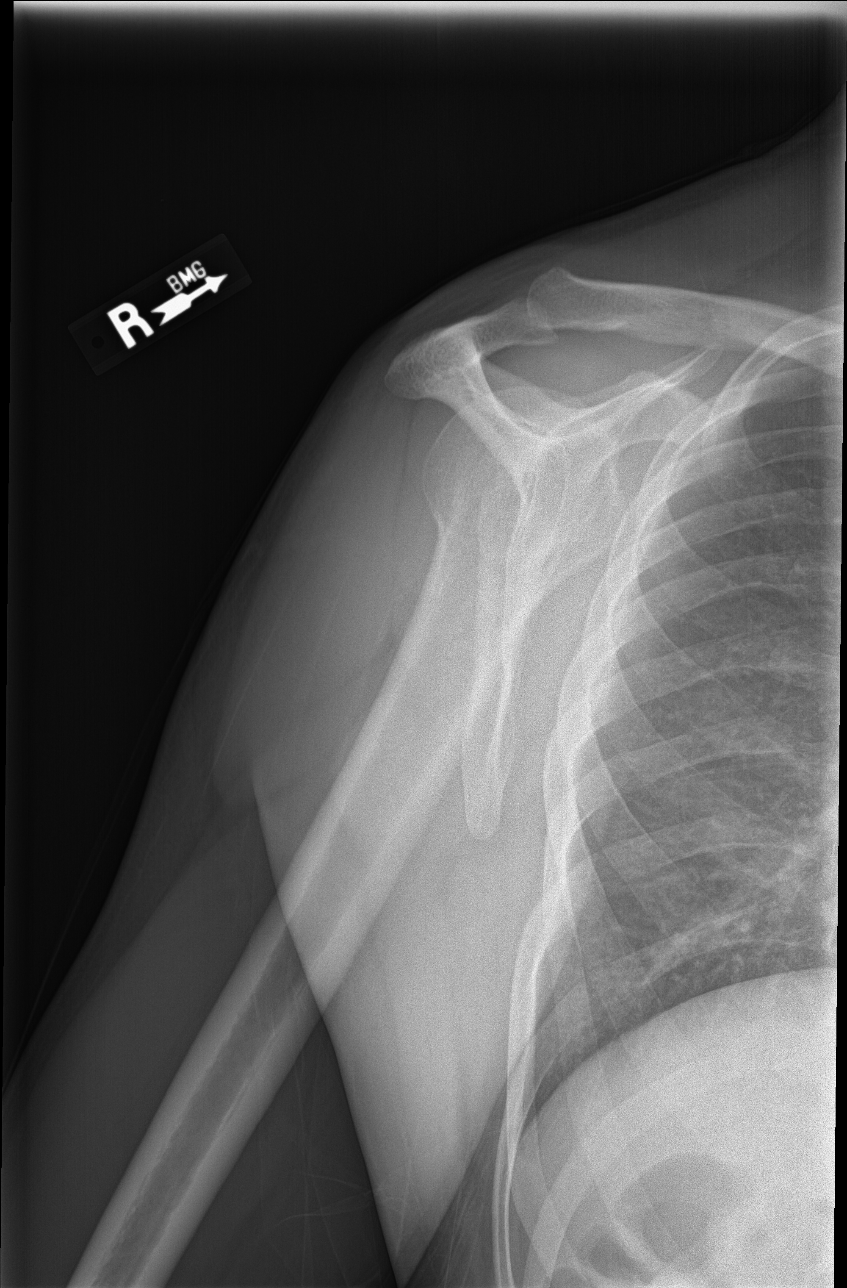

[shoulder axial]
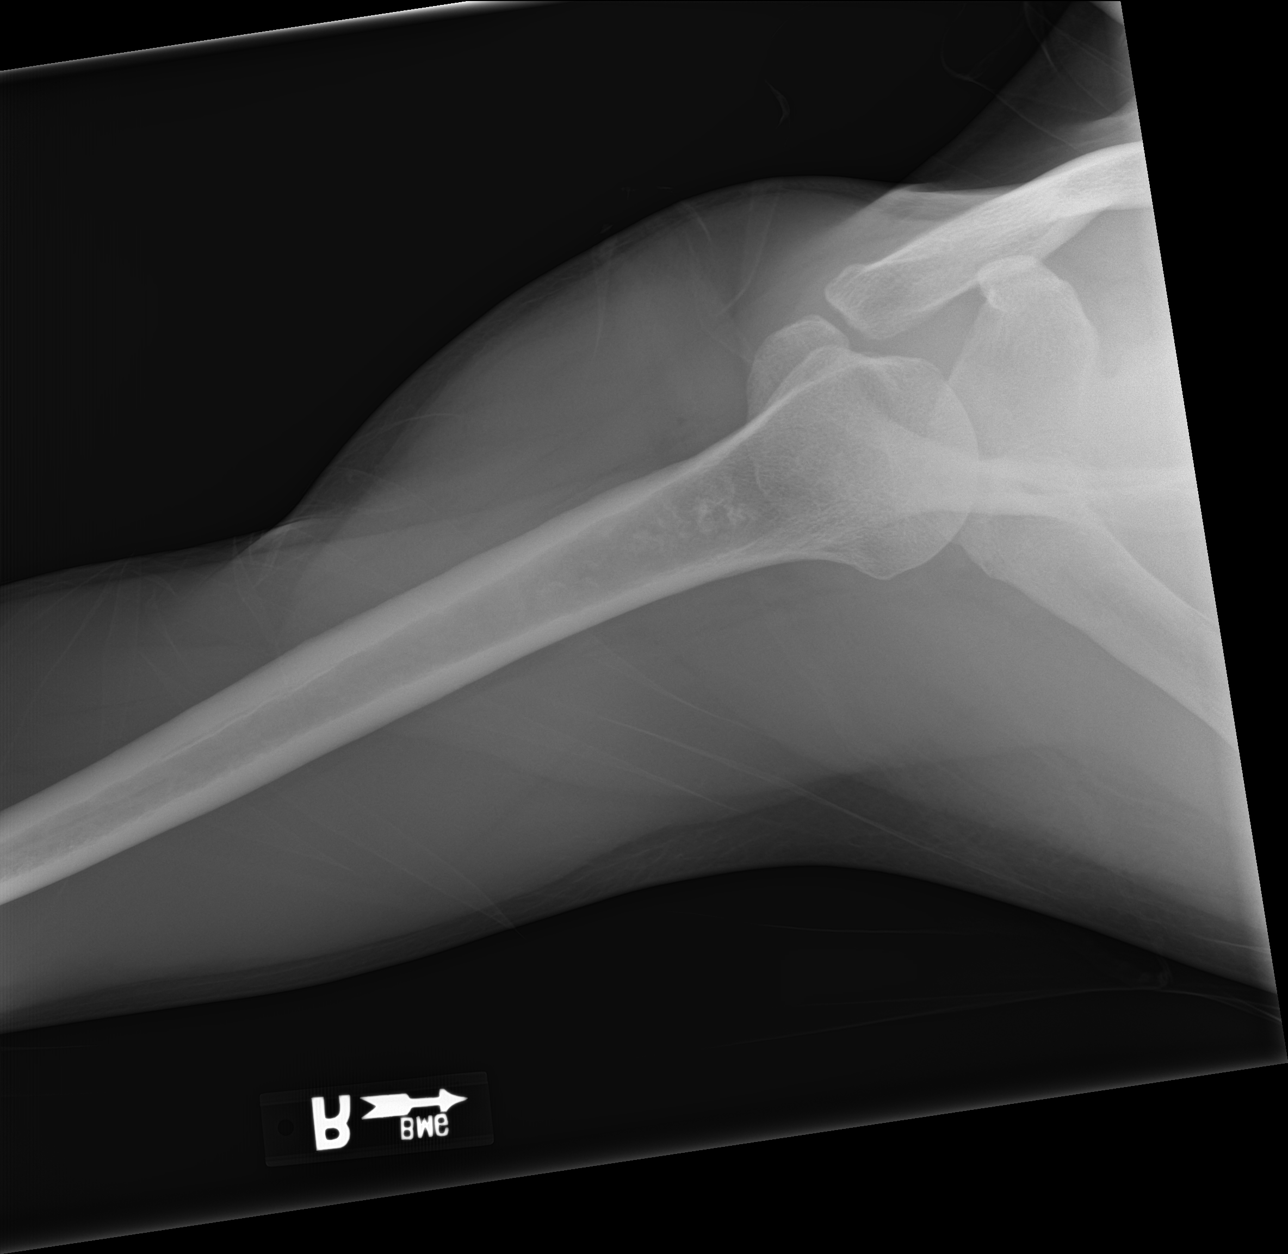

[3 of 3 positions shown; findings below may reference images not displayed]

FINDINGS: There is no evidence of fracture or dislocation. There is no
evidence of arthropathy or other focal bone abnormality. Soft
tissues are unremarkable.
IMPRESSION: Negative.

## 2019-04-25 ENCOUNTER — Other Ambulatory Visit: Payer: Self-pay | Admitting: Emergency Medicine

## 2019-04-25 NOTE — Telephone Encounter (Signed)
Forwarding medication refill request to the clinical pool for review. 

## 2019-05-06 NOTE — Telephone Encounter (Signed)
Patient need an appt for depression before any further refills

## 2019-05-07 ENCOUNTER — Telehealth: Payer: Self-pay | Admitting: *Deleted

## 2019-05-07 NOTE — Telephone Encounter (Signed)
Faxed Rx for Fluoxetine 20 mg to [facility]. Confirmation page 5:51 pm.

## 2019-05-12 NOTE — Telephone Encounter (Signed)
Pt says that he will call back to schedule his med refill appt

## 2021-01-05 LAB — EXTERNAL GENERIC LAB PROCEDURE: COLOGUARD: NEGATIVE
